# Patient Record
Sex: Male | Born: 1955 | Race: White | Hispanic: No | Marital: Married | Smoking: Current every day smoker
Health system: Southern US, Community
[De-identification: ages and names within clinical notes are randomized; demographics above are authoritative.]

## PROBLEM LIST (undated history)

## (undated) DIAGNOSIS — E119 Type 2 diabetes mellitus without complications: Secondary | ICD-10-CM

## (undated) DIAGNOSIS — E785 Hyperlipidemia, unspecified: Secondary | ICD-10-CM

## (undated) DIAGNOSIS — I10 Essential (primary) hypertension: Secondary | ICD-10-CM

## (undated) DIAGNOSIS — R809 Proteinuria, unspecified: Secondary | ICD-10-CM

## (undated) HISTORY — DX: Hyperlipidemia, unspecified: E78.5

## (undated) HISTORY — DX: Essential (primary) hypertension: I10

## (undated) HISTORY — DX: Proteinuria, unspecified: R80.9

## (undated) HISTORY — PX: BACK SURGERY: SHX140

## (undated) HISTORY — PX: APPENDECTOMY: SHX54

## (undated) HISTORY — DX: Type 2 diabetes mellitus without complications: E11.9

## (undated) HISTORY — PX: REPLACEMENT TOTAL KNEE: SUR1224

---

## 2002-05-03 ENCOUNTER — Ambulatory Visit (HOSPITAL_COMMUNITY): Admission: RE | Admit: 2002-05-03 | Discharge: 2002-05-03 | Payer: Self-pay | Admitting: Pulmonary Disease

## 2002-10-16 ENCOUNTER — Ambulatory Visit (HOSPITAL_COMMUNITY): Admission: RE | Admit: 2002-10-16 | Discharge: 2002-10-16 | Payer: Self-pay | Admitting: Internal Medicine

## 2004-12-02 ENCOUNTER — Ambulatory Visit: Payer: Self-pay | Admitting: Cardiology

## 2004-12-08 ENCOUNTER — Ambulatory Visit: Payer: Self-pay

## 2005-07-20 ENCOUNTER — Ambulatory Visit (HOSPITAL_COMMUNITY): Payer: Self-pay | Admitting: Psychiatry

## 2005-07-21 ENCOUNTER — Inpatient Hospital Stay (HOSPITAL_COMMUNITY): Admission: RE | Admit: 2005-07-21 | Discharge: 2005-07-23 | Payer: Self-pay | Admitting: Psychiatry

## 2005-07-22 ENCOUNTER — Ambulatory Visit: Payer: Self-pay | Admitting: Psychiatry

## 2005-09-27 ENCOUNTER — Ambulatory Visit: Payer: Self-pay | Admitting: Internal Medicine

## 2005-11-15 ENCOUNTER — Ambulatory Visit: Payer: Self-pay | Admitting: Internal Medicine

## 2005-12-28 ENCOUNTER — Ambulatory Visit: Payer: Self-pay | Admitting: Internal Medicine

## 2006-03-30 ENCOUNTER — Ambulatory Visit: Payer: Self-pay | Admitting: Internal Medicine

## 2006-03-30 LAB — CONVERTED CEMR LAB
ALT: 13 units/L (ref 0–40)
AST: 18 units/L (ref 0–37)
Albumin: 3.4 g/dL — ABNORMAL LOW (ref 3.5–5.2)
Alkaline Phosphatase: 56 units/L (ref 39–117)
BUN: 38 mg/dL — ABNORMAL HIGH (ref 6–23)
CO2: 23 meq/L (ref 19–32)
Calcium: 9.8 mg/dL (ref 8.4–10.5)
Chloride: 106 meq/L (ref 96–112)
Cholesterol: 170 mg/dL (ref 0–200)
Creatinine, Ser: 2.1 mg/dL — ABNORMAL HIGH (ref 0.4–1.5)
GFR calc non Af Amer: 36 mL/min
Glomerular Filtration Rate, Af Am: 43 mL/min/{1.73_m2}
Glucose, Bld: 99 mg/dL (ref 70–99)
Hgb A1c MFr Bld: 6.7 % — ABNORMAL HIGH (ref 4.6–6.0)
Potassium: 5.5 meq/L — ABNORMAL HIGH (ref 3.5–5.1)
Sodium: 138 meq/L (ref 135–145)
Total Bilirubin: 0.6 mg/dL (ref 0.3–1.2)
Total Protein: 7.3 g/dL (ref 6.0–8.3)
Uric Acid, Serum: 7.2 mg/dL — ABNORMAL HIGH (ref 2.4–7.0)

## 2006-07-13 ENCOUNTER — Ambulatory Visit: Payer: Self-pay | Admitting: Internal Medicine

## 2006-08-07 ENCOUNTER — Ambulatory Visit: Payer: Self-pay | Admitting: Internal Medicine

## 2006-08-11 ENCOUNTER — Encounter: Admission: RE | Admit: 2006-08-11 | Discharge: 2006-08-11 | Payer: Self-pay | Admitting: Neurosurgery

## 2006-09-05 ENCOUNTER — Ambulatory Visit: Payer: Self-pay | Admitting: Internal Medicine

## 2006-09-05 LAB — CONVERTED CEMR LAB
Albumin: 3.6 g/dL (ref 3.5–5.2)
BUN: 27 mg/dL — ABNORMAL HIGH (ref 6–23)
CO2: 28 meq/L (ref 19–32)
Calcium: 8.9 mg/dL (ref 8.4–10.5)
Chloride: 97 meq/L (ref 96–112)
Creatinine, Ser: 1 mg/dL (ref 0.4–1.5)
GFR calc Af Amer: 102 mL/min
GFR calc non Af Amer: 84 mL/min
Glucose, Bld: 95 mg/dL (ref 70–99)
Hgb A1c MFr Bld: 6 % (ref 4.6–6.0)
Phosphorus: 4.4 mg/dL (ref 2.3–4.6)
Potassium: 5.3 meq/L — ABNORMAL HIGH (ref 3.5–5.1)
Sodium: 136 meq/L (ref 135–145)

## 2006-10-02 ENCOUNTER — Telehealth: Payer: Self-pay | Admitting: *Deleted

## 2006-10-03 ENCOUNTER — Telehealth: Payer: Self-pay | Admitting: *Deleted

## 2006-10-17 ENCOUNTER — Encounter: Payer: Self-pay | Admitting: Internal Medicine

## 2006-10-20 ENCOUNTER — Observation Stay (HOSPITAL_COMMUNITY): Admission: RE | Admit: 2006-10-20 | Discharge: 2006-10-21 | Payer: Self-pay | Admitting: Neurosurgery

## 2006-10-31 ENCOUNTER — Telehealth: Payer: Self-pay | Admitting: Internal Medicine

## 2006-11-02 DIAGNOSIS — I10 Essential (primary) hypertension: Secondary | ICD-10-CM | POA: Insufficient documentation

## 2006-11-02 DIAGNOSIS — M109 Gout, unspecified: Secondary | ICD-10-CM | POA: Insufficient documentation

## 2006-11-02 DIAGNOSIS — K219 Gastro-esophageal reflux disease without esophagitis: Secondary | ICD-10-CM | POA: Insufficient documentation

## 2006-11-02 DIAGNOSIS — E119 Type 2 diabetes mellitus without complications: Secondary | ICD-10-CM | POA: Insufficient documentation

## 2006-11-02 DIAGNOSIS — M545 Low back pain: Secondary | ICD-10-CM

## 2006-11-21 ENCOUNTER — Encounter: Payer: Self-pay | Admitting: Internal Medicine

## 2006-11-28 ENCOUNTER — Telehealth: Payer: Self-pay | Admitting: *Deleted

## 2006-11-30 ENCOUNTER — Encounter: Payer: Self-pay | Admitting: Internal Medicine

## 2006-12-12 ENCOUNTER — Ambulatory Visit: Payer: Self-pay | Admitting: Internal Medicine

## 2006-12-28 ENCOUNTER — Telehealth: Payer: Self-pay | Admitting: Internal Medicine

## 2007-01-03 ENCOUNTER — Telehealth: Payer: Self-pay | Admitting: Internal Medicine

## 2007-01-08 ENCOUNTER — Telehealth: Payer: Self-pay | Admitting: Internal Medicine

## 2007-01-09 ENCOUNTER — Encounter: Payer: Self-pay | Admitting: Internal Medicine

## 2007-01-31 ENCOUNTER — Telehealth: Payer: Self-pay | Admitting: Internal Medicine

## 2007-02-13 ENCOUNTER — Telehealth: Payer: Self-pay | Admitting: Internal Medicine

## 2007-02-14 ENCOUNTER — Telehealth: Payer: Self-pay | Admitting: Internal Medicine

## 2007-03-02 ENCOUNTER — Telehealth: Payer: Self-pay | Admitting: Internal Medicine

## 2007-03-15 ENCOUNTER — Ambulatory Visit: Payer: Self-pay | Admitting: Internal Medicine

## 2007-03-15 DIAGNOSIS — J069 Acute upper respiratory infection, unspecified: Secondary | ICD-10-CM | POA: Insufficient documentation

## 2007-03-16 ENCOUNTER — Telehealth: Payer: Self-pay | Admitting: Internal Medicine

## 2007-04-16 ENCOUNTER — Inpatient Hospital Stay (HOSPITAL_COMMUNITY): Admission: RE | Admit: 2007-04-16 | Discharge: 2007-04-19 | Payer: Self-pay | Admitting: Orthopedic Surgery

## 2007-05-11 ENCOUNTER — Telehealth: Payer: Self-pay | Admitting: Internal Medicine

## 2007-05-16 ENCOUNTER — Telehealth: Payer: Self-pay | Admitting: Internal Medicine

## 2007-05-21 ENCOUNTER — Telehealth: Payer: Self-pay | Admitting: Internal Medicine

## 2007-07-09 ENCOUNTER — Telehealth: Payer: Self-pay | Admitting: Internal Medicine

## 2007-07-25 ENCOUNTER — Telehealth (INDEPENDENT_AMBULATORY_CARE_PROVIDER_SITE_OTHER): Payer: Self-pay | Admitting: *Deleted

## 2007-08-02 ENCOUNTER — Ambulatory Visit: Payer: Self-pay | Admitting: Internal Medicine

## 2007-08-03 LAB — CONVERTED CEMR LAB
ALT: 26 units/L (ref 0–53)
Basophils Absolute: 0 10*3/uL (ref 0.0–0.1)
Basophils Relative: 0.1 % (ref 0.0–1.0)
Bilirubin, Direct: 0.1 mg/dL (ref 0.0–0.3)
CO2: 27 meq/L (ref 19–32)
Calcium: 9.8 mg/dL (ref 8.4–10.5)
Creatinine, Ser: 0.9 mg/dL (ref 0.4–1.5)
Direct LDL: 231.8 mg/dL
GFR calc Af Amer: 114 mL/min
HDL: 73.5 mg/dL (ref >39.0)
Hgb A1c MFr Bld: 6.1 % — ABNORMAL HIGH (ref 4.6–6.0)
Lymphocytes Relative: 12.5 % (ref 12.0–46.0)
MCHC: 33.5 g/dL (ref 30.0–36.0)
MCV: 90.8 fL (ref 78.0–100.0)
Monocytes Absolute: 0.6 10*3/uL (ref 0.1–1.0)
Neutro Abs: 8.8 10*3/uL — ABNORMAL HIGH (ref 1.4–7.7)
PSA: 0.43 ng/mL (ref 0.10–4.00)
RDW: 14.8 % — ABNORMAL HIGH (ref 11.5–14.6)
Sodium: 138 meq/L (ref 135–145)
Total Bilirubin: 1 mg/dL (ref 0.3–1.2)
Total CHOL/HDL Ratio: 4.5
Total Protein: 7.4 g/dL (ref 6.0–8.3)
Triglycerides: 312 mg/dL (ref 0–149)
WBC: 10.9 10*3/uL — ABNORMAL HIGH (ref 4.5–10.5)

## 2007-08-16 ENCOUNTER — Telehealth: Payer: Self-pay | Admitting: Internal Medicine

## 2007-08-22 ENCOUNTER — Telehealth: Payer: Self-pay | Admitting: Internal Medicine

## 2007-09-03 ENCOUNTER — Ambulatory Visit: Payer: Self-pay | Admitting: Internal Medicine

## 2007-09-03 DIAGNOSIS — E7800 Pure hypercholesterolemia, unspecified: Secondary | ICD-10-CM | POA: Insufficient documentation

## 2007-09-03 LAB — CONVERTED CEMR LAB: Hgb A1c MFr Bld: 6.1 % — ABNORMAL HIGH (ref 4.6–6.0)

## 2007-09-04 ENCOUNTER — Encounter: Payer: Self-pay | Admitting: Internal Medicine

## 2007-09-04 ENCOUNTER — Telehealth: Payer: Self-pay | Admitting: Internal Medicine

## 2007-09-10 ENCOUNTER — Telehealth: Payer: Self-pay | Admitting: Internal Medicine

## 2007-09-11 ENCOUNTER — Telehealth: Payer: Self-pay | Admitting: Internal Medicine

## 2007-09-11 ENCOUNTER — Ambulatory Visit: Payer: Self-pay | Admitting: Internal Medicine

## 2007-09-11 DIAGNOSIS — F341 Dysthymic disorder: Secondary | ICD-10-CM

## 2007-09-12 ENCOUNTER — Telehealth (INDEPENDENT_AMBULATORY_CARE_PROVIDER_SITE_OTHER): Payer: Self-pay | Admitting: *Deleted

## 2007-09-12 ENCOUNTER — Encounter: Payer: Self-pay | Admitting: Internal Medicine

## 2007-09-13 ENCOUNTER — Telehealth: Payer: Self-pay | Admitting: Internal Medicine

## 2007-09-18 ENCOUNTER — Telehealth: Payer: Self-pay | Admitting: Internal Medicine

## 2007-09-20 ENCOUNTER — Telehealth: Payer: Self-pay | Admitting: Internal Medicine

## 2007-10-02 ENCOUNTER — Ambulatory Visit: Payer: Self-pay | Admitting: Internal Medicine

## 2007-10-05 ENCOUNTER — Telehealth: Payer: Self-pay | Admitting: Internal Medicine

## 2007-10-19 ENCOUNTER — Telehealth: Payer: Self-pay | Admitting: Internal Medicine

## 2007-10-22 ENCOUNTER — Telehealth: Payer: Self-pay | Admitting: Internal Medicine

## 2007-10-23 ENCOUNTER — Ambulatory Visit: Payer: Self-pay | Admitting: Internal Medicine

## 2007-10-30 ENCOUNTER — Telehealth: Payer: Self-pay | Admitting: Internal Medicine

## 2007-11-01 ENCOUNTER — Telehealth: Payer: Self-pay | Admitting: Internal Medicine

## 2007-11-06 ENCOUNTER — Telehealth: Payer: Self-pay | Admitting: Internal Medicine

## 2007-11-13 ENCOUNTER — Encounter: Payer: Self-pay | Admitting: Internal Medicine

## 2007-11-13 ENCOUNTER — Telehealth: Payer: Self-pay | Admitting: Internal Medicine

## 2007-12-11 ENCOUNTER — Telehealth (INDEPENDENT_AMBULATORY_CARE_PROVIDER_SITE_OTHER): Payer: Self-pay

## 2007-12-17 ENCOUNTER — Ambulatory Visit: Payer: Self-pay | Admitting: Internal Medicine

## 2007-12-20 ENCOUNTER — Telehealth: Payer: Self-pay | Admitting: Internal Medicine

## 2007-12-21 ENCOUNTER — Encounter: Payer: Self-pay | Admitting: Internal Medicine

## 2008-01-10 ENCOUNTER — Telehealth: Payer: Self-pay | Admitting: Internal Medicine

## 2008-01-10 ENCOUNTER — Ambulatory Visit: Payer: Self-pay | Admitting: Internal Medicine

## 2008-01-10 DIAGNOSIS — M25569 Pain in unspecified knee: Secondary | ICD-10-CM | POA: Insufficient documentation

## 2008-01-11 ENCOUNTER — Telehealth: Payer: Self-pay | Admitting: Internal Medicine

## 2008-01-31 ENCOUNTER — Telehealth: Payer: Self-pay | Admitting: Internal Medicine

## 2008-02-25 ENCOUNTER — Emergency Department (HOSPITAL_COMMUNITY): Admission: EM | Admit: 2008-02-25 | Discharge: 2008-02-25 | Payer: Self-pay | Admitting: Emergency Medicine

## 2008-02-29 ENCOUNTER — Telehealth: Payer: Self-pay | Admitting: Internal Medicine

## 2008-03-06 ENCOUNTER — Telehealth: Payer: Self-pay | Admitting: Internal Medicine

## 2008-03-07 ENCOUNTER — Telehealth: Payer: Self-pay | Admitting: Internal Medicine

## 2008-03-11 ENCOUNTER — Telehealth: Payer: Self-pay | Admitting: Internal Medicine

## 2008-04-16 ENCOUNTER — Telehealth (INDEPENDENT_AMBULATORY_CARE_PROVIDER_SITE_OTHER): Payer: Self-pay | Admitting: *Deleted

## 2008-04-17 ENCOUNTER — Encounter: Payer: Self-pay | Admitting: Internal Medicine

## 2008-04-24 ENCOUNTER — Telehealth: Payer: Self-pay | Admitting: Internal Medicine

## 2008-05-09 ENCOUNTER — Encounter: Payer: Self-pay | Admitting: Internal Medicine

## 2008-05-16 ENCOUNTER — Telehealth: Payer: Self-pay | Admitting: Internal Medicine

## 2008-05-19 ENCOUNTER — Telehealth: Payer: Self-pay | Admitting: Internal Medicine

## 2008-07-01 ENCOUNTER — Telehealth: Payer: Self-pay | Admitting: Internal Medicine

## 2008-09-23 ENCOUNTER — Telehealth: Payer: Self-pay | Admitting: Internal Medicine

## 2008-09-24 ENCOUNTER — Telehealth: Payer: Self-pay | Admitting: Internal Medicine

## 2010-02-03 ENCOUNTER — Telehealth: Payer: Self-pay | Admitting: Internal Medicine

## 2010-04-27 NOTE — Progress Notes (Signed)
Summary: No improvement in sx  Phone Note Call from Patient Call back at [facility] Phone [phone]   Caller: Patient Call For: Jeffrey [name] Summary of Call: OV Monday, chest congestion, wheezing, Rx Pro-Air and Robutussin as needed for cough.  Pt calling to report no improvement, still achy all over, diarrhea, lethargic and weak, nausea, no appetite, unsure of temp.  Pt drinking plenty of fluids, and getting lots of rest [facility] [place]    Prenisone 10 mg  #20 one BID  Appended Document: No improvement in sx    Clinical Lists Changes  Medications: Added new medication of PREDNISONE 10 MG TABS (PREDNISONE) one tab two times a day - Signed Rx of PREDNISONE 10 MG TABS (PREDNISONE) one tab two times a day;  #20 x 0;  Signed;  Entered by: Sid Falcon LPN;  Authorized by: Gordy Savers  MD;  Method used: [facility]ically [facility]  [id number], [place], [place], [place]  [address], Ph: [phone] or [phone], Fax: [phone]    Prescriptions: PREDNISONE 10 MG TABS (PREDNISONE) one tab two times a day  #20 x 0   Entered by:   Sid Falcon LPN   Authorized by:   Gordy Savers  MD   Signed by:   Sid Falcon LPN on 34/74/2595   Method used:   [facility]ronically to        [facility]  [id number] (retail)       [address]       [place], [place]  [address]       Ph: [phone] or [phone]       Fax: [phone]   RxID:   [id number]   Rx sent electronically, pt informed. Sid Falcon LPN  December 20, 2007 2:12 PM

## 2010-04-27 NOTE — Progress Notes (Signed)
Summary: REQUEST FOR APPT......?  Phone Note Call from Patient   Caller: Patient  [phone]   Summary of Call: Pt called and wanted to know if Dr Kirtland Bouchard will see him.... 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 Pt advised that he is currently drug free and would like to schedule appt with Dr Kirtland Bouchard....Marland KitchenMarland KitchenI reminded pt that EMR shows he had been d/c from practice and he acknowledged same but states that a lot has changed and he's not the same person... adv he thinks a lot of Dr Kirtland Bouchard - would like to see him again... Asked that request atleast be sent to Dr Kirtland Bouchard...........?    Initial call taken by: Debbra Riding,  February 03, 2010 4:44 PM  Follow-up for Phone Call        no- d/c from practice is permanent Follow-up by: Gordy Savers  MD,  February 04, 2010 8:24 AM  Additional Follow-up for Phone Call Additional follow up Details #1::        [facility]  advising pt to c/b so he can be notified that d/c from practice is permanent per [facility] policy.  Additional Follow-up by: Debbra Riding,  February 04, 2010 9:56 AM

## 2010-08-10 NOTE — H&P (Signed)
Jeffrey [name], Jeffrey [name] NO.:  [id number]   MEDICAL RECORD NO.:  [id number]         PATIENT TYPE:  LINP   LOCATION:                               FACILITY:  [facility]   PHYSICIAN:  Ollen Gross, M.D.    DATE OF BIRTH:  [birth year]   DATE OF ADMISSION:  04/16/2007  DATE OF DISCHARGE:                              HISTORY & PHYSICAL   CHIEF COMPLAINT:  Right knee pain.   HISTORY OF PRESENT ILLNESS:  Patient is a 55 year old male who has been  seen by Dr. Lequita Halt for increasing right knee pain.  He has known  significant arthritis.  He has been treated conservatively in the past  including injections.  Unfortunately, did not get much relief.  He is  seen in the office where x-rays show significant amount of arthritis  with bone-on-bone, severe bony erosion of the medial tibial plateau.  He  has end-stage significant arthritis.  Felt to be a good candidate for  surgery.  Risks and benefits discussed.  Patient subsequently admitted  to the hospital.   ALLERGIES:  Penicillin.   CURRENT MEDICATIONS:  Actos, simvastatin, Cozaar, Tricor, omeprazole,  Ambien, allopurinol, alprazolam, hydrocodone, iron, and multivitamin.   PAST MENTAL HISTORY:  1. Gout.  2. History of seizures one time.  3. Mild anxiety.  4. Hypercholesterolemia.  5. Hypertension.  6. Reflux.  7. Diabetes.  8. History of pancreatitis.   PAST SURGICAL HISTORY:  1. Appendix.  2. Back surgery.   FAMILY HISTORY:  Father with history of congestive heart failure.  Mother with diabetes.  Brother with heart disease and CABG.   SOCIAL HISTORY:  Married.  Works in [employer].  2 stepchildren.  Wife will  be assisting with care after surgery.   REVIEW OF SYSTEMS:  GENERAL:  No fevers, chills, night sweats.  NEURO:  One-time seizure only in March 2007; etiology unknown.  No further  seizures or syncope or paralysis.  RESPIRATORY:  No shortness of  productive cough or hemoptysis.  CARDIOVASCULAR:  No chest pain,  angina,  orthopnea.  GI:  No nausea, vomiting, diarrhea or constipation.  GU:  No  dysuria, hematuria or discharge.  MUSCULOSKELETAL:  Right knee.   PHYSICAL EXAMINATION:  VITAL SIGNS:  Pulse 74, respirations 12, blood  pressure 142/86.  GENERAL:  55 year old white male; well-nourished, well-developed,  overweight, obese.  No acute distress.  Alert, oriented and cooperative.  Good historian.  Accompanied by his wife.  HEENT:  Normocephalic, atraumatic.  Pupils round, reactive.  Oropharynx  clear.  EOMs intact.  NECK:  Supple.  No bruits.  CHEST:  Clear anterior-posterior chest walls.  HEART:  Regular rate and rhythm.  No murmur.  S1, S2 noted.  ABDOMEN:  Soft, nontender.  Bowel sounds present.  Round.  BREASTS/GENITALIA:  Not done, not pertinent to present illness.  EXTREMITIES:  Right knee, marked crepitus on passive range of motion.  Range of motion of 5-120.  Tender more medial than lateral.   IMPRESSION:  1. Severe end-stage arthritis, right knee.  2. History of gout.  3. History of seizures x1, March 2007; etiology  unknown.  4. Mild anxiety.  5. Hypercholesterolemia.  6. Hypertension.  7. Reflux disease.  8. Diabetes.  9. Past history of pancreatitis.   PLAN:  Patient admitted to [facility] to undergo right total  knee replacement arthroplasty.  Surgery will be performed by Dr. Ollen Gross.      Alexzandrew L. Perkins, P.A.C.      Ollen Gross, M.D.  Electronically Signed    ALP/MEDQ  D:  04/15/2007  T:  04/16/2007  Job:  [id number]   cc:   Gordy Savers, MD  [address] [place]  [place] [address]   Madolyn Frieze. Jens Som, MD, [facility]  [address]  [address]  [place]  [place] [address]   Ollen Gross, M.D.  Fax: [phone]

## 2010-08-10 NOTE — Discharge Summary (Signed)
NAMESAIF, [name] NO.:  [id number]   MEDICAL RECORD NO.:  [id number]          PATIENT TYPE:  INP   LOCATION:  1615                         FACILITY:  [facility]   PHYSICIAN:  Ollen Gross, M.D.    DATE OF BIRTH:  01-28-1956   DATE OF ADMISSION:  04/16/2007  DATE OF DISCHARGE:  04/19/2007                               DISCHARGE SUMMARY   ADMISSION DATE:  1. Severe end-stage osteoarthritis, right knee.  2. History of gout.  3. History of seizure x1 in March 2007, etiology unknown.  4. Mild anxiety.  5. Hypercholesterolemia.  6. Hypertension.  7. Reflux disease.  8. Diabetes.  9. Past history of pancreatitis.   DISCHARGE DIAGNOSES:  1. Osteoarthritis, right knee, status post right total knee      arthroplasty.  2. History of gout.  3. History of seizure x1 in March 2007, etiology unknown.  4. Mild anxiety.  5. Hypercholesterolemia.  6. Hypertension.  7. Reflux disease.  8. Diabetes.  9. Past history of pancreatitis.   PROCEDURE:  On April 16, 2007, a right total knee surgery with Dr.  Ollen Gross, assisted by Alexzandrew L. Perkins, P.A.-C.  Anesthesia  was spinal with Duramorph.   CONSULTATIONS:  None.   HISTORY:  Jeffrey [name] is a 55 year old male with severe end-stage arthritis of  the right knee with progressive worsening pain with significant varus  and alignment deformity.  He now presents for a total knee.   LABORATORY DATA:  CBC showed a hemoglobin of 14.2, hematocrit 41.2,  white cell count 6.5, platelets 297.  Postoperative hemoglobin 12.4.  The last H&H 11.6 and 33.0 respectively.  PT and PTT preoperatively were  12.3 and 29 respectively.  INR 0.9.  Serial pro-times were followed and  were 122.4 and 1.9.  Chemistry panel on admission all within normal  limits.  Serial BMETs were followed.  Electrolytes remained within  normal limits.  Preoperative urinalysis with positive protein, 0-2 white  cells, 0-2 red cells, otherwise negative with only  rare bacteria.  Blood  group type O-positive.   HOSPITAL COURSE:  The patient was admitted to [facility] and  tolerated the procedure well.  He was later transferred to the recovery  room and then to the orthopedic floor.  He was started on PCA and p.o.  analgesics following surgery.  He had a fair amount of pain on the  evening of surgery and the morning of day one.  We added OxyContin for  better pain control.  He had decent output.  He started getting up out  of bed on day one.  By day two the pain was under a little bit better  control.  He was getting up and moving around with therapy, walking  about 150 feet.  Dressing change and the incision looked good.  No signs  of infection.  Continued to progress well and was ready to go home by  postoperative day three on April 19, 2007.   DISPOSITION:  The patient was discharged on April 19, 2007.   DISCHARGE MEDICATIONS:  1. Coumadin.  2. OxyContin 20 mg.  3. Percocet.  4. Robaxin.   DIET:  To resume his home diet.   FOLLOWUP:  To follow up in two weeks.   ACTIVITY:  Weightbearing as tolerated.  Home health PT with total knee  protocol.   DISPOSITION/CONDITION ON DISCHARGE:  Discharge to home improved.      Alexzandrew L. Perkins, P.A.C.      Ollen Gross, M.D.  Electronically Signed    ALP/MEDQ  D:  06/05/2007  T:  06/05/2007  Job:  [id number]   cc:   Gordy Savers, MD  [address] [place]  [place] [address]   Madolyn Frieze. Jens Som, MD, [facility]  [address]  [address]  [place]  [place] [address]

## 2010-08-10 NOTE — Assessment & Plan Note (Signed)
[facility]                                 ON-CALL NOTE   Jeffrey, [name]                       MRN:          [id number]  DATE:09/10/2007                            DOB:          February 19, 1956    Phone number [phone].   PRIMARY CARE PHYSICIAN:  Dr. Amador [name]   SUBJECTIVE:  The patient was returning a call from Dr. Amador [name] about  getting him disability.   ASSESSMENT AND PLAN:  Tried to call the [facility] office after hours to  see if Dr. Amador [name] was still present, no answer on back line.  The  patient was told that if I did not return his call, that Dr. Amador [name]  would contact him tomorrow.     Jeffrey Nora, MD  Electronically Signed    AB/MedQ  DD: 09/10/2007  DT: 09/11/2007  Job #: [id number]

## 2010-08-10 NOTE — Op Note (Signed)
Jeffrey [name], Jeffrey [name] NO.:  [id number]   MEDICAL RECORD NO.:  [id number]          PATIENT TYPE:  INP   LOCATION:  0007                         FACILITY:  [facility]   PHYSICIAN:  Ollen Gross, M.D.    DATE OF BIRTH:  [birth year]   DATE OF PROCEDURE:  04/16/2007  DATE OF DISCHARGE:                               OPERATIVE REPORT   PREOPERATIVE DIAGNOSIS:  Osteoarthritis right knee.   POSTOPERATIVE DIAGNOSIS:  Osteoarthritis right knee.   PROCEDURE:  Right total knee arthroplasty.   SURGEON:  Ollen Gross, M.D.   ASSISTANT:  Avel Peace PA-C   ANESTHESIA:  Spinal with Duramorph.   ESTIMATED BLOOD LOSS:  Minimal.   DRAINS:  Hemovac times one.   TOURNIQUET TIME:  52 minutes at 300 mmHg.   COMPLICATIONS:  None.   CONDITION:  Stable to recovery room.   BRIEF CLINICAL NOTE:  Jeffrey [name] is a 55 year old male who has severe end-  stage arthritis of the right knee with progressively worsening pain and  dysfunction.  He had significant varus deformity with some fragmentation  of the base of the defect in the medial tibia.  He presents now for  total knee arthroplasty secondary to failure of nonop management.   PROCEDURE IN DETAIL:  After successful administration of spinal  anesthetic, a tourniquet placed on the right thigh and right lower  extremity prepped and draped in the usual sterile fashion.  The  extremity was wrapped in Esmarch, knee flexed and tourniquet inflated to  300 mmHg.  Midline incision is made with 10 blade through subcutaneous  tissue to the level of the extensor mechanism.  A fresh blade is used  make a medial parapatellar arthrotomy.  Soft tissue over the proximal  medial tibia subperiosteally elevated to the joint line with the knife  into the semimembranosus bursa with a Cobb elevator.  There was a lot of  spurring medially and I removed some of those spurs at this point.  Laterally we elevated the soft tissue with attention being paid to  avoid  patellar tendon on tibial tubercle.  The patella was everted, knee  flexed 90 degrees, ACL and PCL removed.  A drill was used create a  starting hole in the distal femur and the canal was thoroughly  irrigated.  The 5 degrees right valgus alignment guide is placed and  referencing off the posterior condyles rotations marked and the block  pinned to remove 11 mm of the distal femur.  I took 11 because of his  preop flexion contracture.  Distal femoral resection is made with an  oscillating saw.  Sizing block is placed and five is most appropriate.  Rotations marked off the epicondylar axis.  The size 5 cutting block is  placed and the anterior-posterior and chamfer cuts made.   Tibia subluxed forward and menisci removed.  The extramedullary tibial  alignment guide is placed referencing proximally at the medial aspect of  the tibial tubercle and distally along the second metatarsal axis and  tibial crest.  The block is pinned to remove about 2 mm off the tibial  defect  medially.  Tibial resection is made with an oscillating saw.  Size 5 is the most appropriate tibial component.  I decided to use an  MBT revision tray because of his size and because of the defect  medially.  I felt as though the standard tray would not give [place] enough  metaphyseal fixation to help bypass some of the medial bone.  The  proximal drill and canal drill was used for the MTB revision tray.  We  then did the keel punch.  It was an excellent fit with this tray.  The  femoral preparation is then completed with the intercondylar cut.   Size 5 MBT tibial tray with a size 5 posterior stabilized femur and a 10  mm posterior stabilized rotating platform inserter placed.  With a 10,  full extension is achieved with excellent varus and valgus balance  throughout full range of motion.  Patella was everted, thickness  measured 27 mm.  Freehand resection taken to 15 mm, 38 template is  placed, lug holes were drilled,  trial patella was placed and it tracks  normally.  Osteophytes removed off the posterior femur with the trial in  place.  All trials were removed and the cut bone surfaces are prepared  with pulsatile lavage.  Cement was mixed and once ready for  implantation, a size 5 MBT revision tray, size 5 posterior stabilized  femur and 38 patella are cemented in place.  Patella is held with a  clamp.  Trial 10-mm inserts placed, knee held in full extension and all  extruded cement removed.  When the cement is fully hardened then the  permanent 10 mm posterior stabilized rotating platform insert is placed  into the tibial tray.  Wound was copiously irrigated with saline  solution and FloSeal injected on the medial and lateral gutters and  posterior capsule.  Moist sponge is placed and tourniquet released with  total time of 52 minutes.  Mild bleeding stopped with electrocautery.  Further irrigation was performed and the extensor mechanism closed over  Hemovac drain with interrupted #1 PDS.  Patella tracks normally in  flexion against gravity to about 125 degrees at which point the  posterior calf and thigh meet.  Subcu is then closed with interrupted 2-  0 Vicryl subcuticular running 4-0 Monocryl.  The incision is cleaned and  dried and Steri-Strips and a bulky sterile dressing applied.  He is  placed into a knee immobilizer, awakened and transported to recovery in  stable condition.      Ollen Gross, M.D.  Electronically Signed     FA/MEDQ  D:  04/16/2007  T:  04/17/2007  Job:  [id number]

## 2010-08-10 NOTE — Op Note (Signed)
NAME:  LASALLE, ABEE NO.:  [id number]   MEDICAL RECORD NO.:  [id number]          PATIENT TYPE:  AMB   LOCATION:  SDS                          FACILITY:  [facility]   PHYSICIAN:  Reinaldo Meeker, M.D. DATE OF BIRTH:  1956-03-18   DATE OF PROCEDURE:  10/20/2006  DATE OF DISCHARGE:                               OPERATIVE REPORT   PREOPERATIVE DIAGNOSIS:  Spondylosis L3-L4 and L4-L5, right, with  possible herniated disc.   POSTOPERATIVE DIAGNOSIS:  Spondylosis L3-L4 and L4-L5, right, with  bulging disc.   PROCEDURE:  Right L3-L4 and L4-L5 interlaminar laminotomy for  decompression of L4 and L5 nerve roots.   SURGEON:  Reinaldo Meeker, M.D.   ASSISTANT:  Tia Alert, M.D.   PROCEDURE IN DETAIL:  After being placed in the prone position, the  patient's back was prepped and draped in the usual sterile fashion.  Localizing x-ray was taken prior to incision to identify the appropriate  level.  A midline incision was made above the spinous process of L3, L4,  and L5.  Using the Bovie cutting current, the incision was carried down  the spinous processes.  Subperiosteal dissection was then carried out on  the right sided spinous process and lamina of facet joint.  A self-  retaining retractor was placed for exposure.  An x-ray showed we  approached the appropriate level.  Starting at L3-L4. a laminotomy was  performed by removing the inferior 1/2 of the L3 lamina, the medial 1/3  of the facet joint, and the superior 1/3 of the L4 lamina.  Residual  bone and hypertrophic ligamentum flavum were removed in a piecemeal  fashion.  The L4 nerve root was then followed out to the foramen for a  thorough decompression.  Attention was then turned L4-L5 where a similar  procedure was carried out.  The superior 1/2 of the L4 lamina, the  medial 1/3 of the facet joint, and the superior 1/2 of the L5 lamina  were once again removed.  The ligamentum flavum along with a ligamentum  flavum cyst were removed to decompress the dura and the L5 nerve was  tracked generously at its foramen, as well.  At this time, the midline  was undercut to help with the midline stenosis.  At this time,  inspection was carried out at both levels for any evidence of residual  compression and none could be identified.  The discs were evaluated and  found not to be herniated, only bulging, and not in need of removal.  At  this time, large amounts of irrigation were carried out.  Any bleeding  was controlled with bipolar coagulation and Gelfoam.  The wound was then  closed in multiple layers of Vicryl in the muscle, fascia, subcutaneous,  and subcuticular tissues and staples were placed on the skin.  A sterile  dressing was then applied.  The patient was extubated and taken to the  recovery room  in stable condition.           ______________________________  Reinaldo Meeker, M.D.     ROK/MEDQ  D:  10/20/2006  T:  10/20/2006  Job:  [id number]

## 2010-08-13 NOTE — Assessment & Plan Note (Signed)
[facility]                                 ON-CALL NOTE   [name], [name]                       MRN:          [id number]  DATE:08/13/2007                            DOB:          Nov 28, 1955    PATIENT NAME:  Jeffrey [name].   DATE OF INTERACTION:  Aug 13, 2007, at 5:22 p.m.   PHONE NUMBER:  [phone].   OBJECTIVE:  Patient is a diabetic, who had back surgery and knee  replacement last year, had a bad case of chiggers not long ago and was  seen by Dr. Schuyler Amor and put on prednisone.  Thinks he also was  bitten by ticks and now is having vomiting and headache, has some other  complaints as well.  I told the patient he really needs to be evaluated  and he will call tomorrow for an appointment to come in and be seen.  He  lives in [place], [place], and he lives up near [place].   PRIMARY CARE [place] Service:  Gordy Savers, MD, and home office is  [facility].     Arta Silence, MD  Electronically Signed    RNS/MedQ  DD: 08/13/2007  DT: 08/13/2007  Job #: [id number]

## 2010-08-13 NOTE — Assessment & Plan Note (Signed)
[facility] OFFICE NOTE   Jeffrey [name], Jeffrey [name]                     MRN:          [id number]  DATE:07/13/2006                            DOB:          1955-08-06    A 55 year old gentleman with a history of diabetes, hypertension,  dyslipidemia, gout. He stopped allopurinol stating that it has not  helped his gout. He was unaware this was a preventative strategy and not  for acute gout. Despite our prior warnings, he continues to take Indocin  p.r.n. gout. He does not take colchicine because says it causes GI  upset, but apparently he took this on an empty stomach. He is scheduled  for rheumatology evaluation per his request for further evaluation of  his gout.   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   He presents with significant weight gain over the past three months.   PHYSICAL EXAMINATION:  Blood pressure 150/104, weight 273.  Fundi not well-visualized, but no gross retinopathy.  NECK: No bruits.  CHEST: Clear.  CARDIOVASCULAR: Normal.  ABDOMEN: Obese.   IMPRESSION:  1. Diabetes.  2. Hypertension, poor control.  3. Chronic gout.  4. Obesity.   DISPOSITION:  Detailed written instructions were provided. He will  resume allopurinol at a dose of 300 mg daily. Had tolerated a 600 mg  daily in divided doses earlier. He is scheduled for rheumatology  evaluation and will consider increasing the allopurinol dose at that  time. His uric acid level was 7.2 on 300 mg daily with breakthrough  acute gouty arthritis. He is on Tricor which has a uricose uric effect.  Will discontinue the lisinopril and place on Cozaar 100 mg daily for  added uricose uric effect. Will reassess his blood pressure in two  weeks. He was told to dispose of all Indocin and not take any NSAIDs. He  will discuss with his rheumatologist risks/benefit ratio of baby aspirin  as far as primary prevention versus aggravating  gout. Repeat renal  function indices will be checked in two weeks and his blood pressure  will be reassessed. His controlled medications were refilled. He also  states that he is agreeable to having a followup eye examination which  is he is overdue for.     Gordy Savers, MD  Electronically Signed   PFK/MedQ  DD: 07/13/2006  DT: 07/13/2006  Job #: [id number]

## 2010-08-13 NOTE — Op Note (Signed)
NAME:  Jeffrey [name], Jeffrey [name]                        ACCOUNT NO.:  [id number]   MEDICAL RECORD NO.:  [id number]                   PATIENT TYPE:  AMB   LOCATION:  DAY                                  FACILITY:  [facility]   PHYSICIAN:  Lionel December, M.D.                 DATE OF BIRTH:  10/05/1955   DATE OF PROCEDURE:  10/16/2002  DATE OF DISCHARGE:                                 OPERATIVE REPORT   PROCEDURE:  Esophagogastroduodenoscopy followed by total colonoscopy.   INDICATIONS FOR PROCEDURE:  Jeffrey [name] is a 55 year old Caucasian male who was  recently found to have iron deficiency anemia.  He does not history of  melena, rectal bleeding, or heme-positive stools.  He has been on Vioxx, and  on top of that, he uses indomethacin sporadically for gout.  He is  undergoing diagnostic evaluation.  The procedure was reviewed with the  patient, and informed consent was obtained.   PREOPERATIVE MEDICATIONS:  Cetacaine spray for oropharyngeal topical  anesthesia, Demerol 50 mg IV, Versed 5 mg IV in divided doses.   FINDINGS:  The procedure was performed in the endoscopy suite.  The  patient's vital signs and O2 saturations were monitored during the procedure  and remained stable.   PROCEDURE #1, ESOPHAGOGASTRODUODENOSCOPY:  The patient was placed in the  left lateral recumbent position.  The Olympus videoscope was passed via the  oropharynx without any difficulty into the esophagus.   Esophagus:  The mucosa of the esophagus was normal throughout.  The  squamocolumnar junction was wavy, and there was a small sliding hiatal  hernia no more than 3 cm in length.   Stomach:  It was empty and distended very well with insufflation.  A few  erosions were noted at antrum as well as petechiae.  No ulcer crater was  noted.  The pyloric channel was patent.  The angularis, fundus, and cardia  were examined by retroflexing the scope and were normal other than  petechiae.   Duodenum:  Examination of the bulb  revealed normal mucosa.  The folds of the  mucosa and second part of the duodenum were normal.  The endoscope was  withdrawn and the patient prepared for procedure #2.   PROCEDURE #2, COLONOSCOPY:  Rectal examination was performed.  No  abnormality noted on external or digital exam.  The Olympus videoscope was  placed into the rectum and advanced to the region of the sigmoid colon and  beyond.  Redundant sigmoid colon.  The scope was passed beyond the splenic  flexure and could not be advanced any further.  The scope was exchanged with  a pediatric scope.  The patient was then placed on his right side, and I was  able to pass the scope to the hepatic flexure and finally to the cecum.  The  cecum was identified by the appendiceal orifice and ileocecal valve.  Pictures were taken for the record.  As the scope was withdrawn, the colonic  mucosa was carefully examined.  There was a small polyp at the ascending  colon which was ablated by cold biopsy.  Two tiny diverticula were noted at  the sigmoid colon.  The rectal mucosa was normal.  The scope was retroflexed  to examine the anorectal junction, and a single hemorrhoid was noted above  the dentate line.  The endoscope was straightened and withdrawn.  The  patient tolerated the procedure well.    FINAL DIAGNOSES:  1. Small sliding hiatal hernia with a wavy gastroesophageal junction.  2. Erosive antral gastritis with multiple petechiae.  Suspect his blood loss     could be from nonsteroidal anti-inflammatory drug-induced gastric injury     but need to make sure he does not have Helicobacter pylori infection.  3. Colonoscopy performed to the cecum.  Small polyp ablated by cold biopsy     from ascending colon.  4. Diverticula at sigmoid colon and single internal hemorrhoid.   RECOMMENDATIONS:  1. Will stop his ranitidine.  He will continue antireflux measures as     before.  2. Omeprazole 20 mg p.o. q.a.m.  Prescription given for 30 with  11 refills.  3. High fiber diet.  4. He will continue his iron pill.  May take twice a day with meals.  5. H pylori serology will be checked.  I will be contacting the patient with     the results of biopsy and blood test and further recommendations.                                               Lionel December, M.D.    NR/MEDQ  D:  10/16/2002  T:  10/16/2002  Job:  [id number]   cc:   Ramon Dredge L. Juanetta Gosling, M.D.  [address]  [place]  [place] [address]  Fax: [phone]

## 2010-08-13 NOTE — Assessment & Plan Note (Signed)
[facility]                                 ON-CALL NOTE   [name], Jeffrey                       MRN:          [id number]  DATE:11/25/2006                            DOB:          1955/10/07    The patient's wife is calling because he has been out of town.  He has  severe pain.  He thinks it may be gout.  Advised him to go to the local  urgent care for eval.  When I called the phone number they gave [place] was  disconnected.  I left them a message at the answering service that when  they call back to direct them to the local urgent care or emergency room  in [place] for eval.     Tinnie Gens A. Tawanna Cooler, MD  Electronically Signed    JAT/MedQ  DD: 11/25/2006  DT: 11/26/2006  Job #: [id number]

## 2010-08-13 NOTE — Consult Note (Signed)
Jeffrey [name], Jeffrey [name] NO.:  [id number]   MEDICAL RECORD NO.:  [id number]                  PATIENT TYPE:   LOCATION:                                       FACILITY:   PHYSICIAN:  Lionel December, M.D.                 DATE OF BIRTH:  November 13, 1955   DATE OF CONSULTATION:  10/03/2002  DATE OF DISCHARGE:                                   CONSULTATION   GI CONSULTATION   REQUESTING PHYSICIAN:  Oneal Deputy. Juanetta Gosling, M.D.   REASON FOR CONSULTATION:  Iron-deficiency anemia.   HISTORY OF PRESENT ILLNESS:  The patient is a 55 year old Caucasian  gentleman, patient of Dr. Juanetta Gosling, who presents for a further evaluation of  the above stated findings.  Approximately 1 month ago he began having  ringing in his ears, fatigue, and dizzy feeling. He saw Dr. Juanetta Gosling who  checked blood work.  He was found to have a hemoglobin of 10.6, hematocrit  33.3, and MCV 86.  Iron was low normal at 69.  Iron saturation is low at  15%, TIBC elevated at 468.  B12 and folate were normal.  Three stool  Hemoccults were negative.   The patient began iron therapy about 3 weeks ago and started feeling better  at this point.  The patient actually found out that he was anemic when he  tried to donate blood with the [employer].  He was denied.  He  denies any frequent blood donation. He denies any melena or rectal bleeding  or abdominal pain.  He has a history of reflux, especially in the past when  he consumed alcohol.  He is on Zantac 150 mg b.i.d. with no reflux symptoms.  He has noted a decrease in the number of bowel movements.  He has lost about  25 pounds on a low carb diet.  He denies any history of peptic ulcer  disease. He has never had an upper endoscopy.  He quit tobacco and alcohol  use over 3 years ago.  He has been on Vioxx intermittently over the last 4-5  months.   CURRENT MEDICATIONS:  1. Avandamet 2 mg/5000 mg 1 b.i.d.  2. Mavik 4 mg daily.  3. Wellbutrin XL 150  mg b.i.d.  4. Hydrochlorothiazide 25 mg 1/2 tablet q.h.s.  5. Ambien 10 mg q.h.s.  6. Indomethacin 75 mg p.r.n.  7. Gemfibrozil 600 mg b.i.d.  8. Vioxx 25 mg daily.  9. Hydrocodone 5/500 one q.i.d.  10.      Crestor 10 mg b.i.d.  11.      Ranitidine 150 mg b.i.d.  12.      Equate multivitamin.  13.      Lycopene 1 daily.  14.      L-lysine 500 mg daily.  15.      Vitamine E 400 IU daily.  16.      Glucosamine chondroitin 1 q.i.d.  17.      Iron 65 mg 1 daily.   ALLERGIES:  PENICILLIN.   PAST MEDICAL HISTORY:  1. Hypertension.  2. Diabetes mellitus.  3. Hypercholesterolemia.  4. He has gastroesophageal reflux disease.  5. Back problems, contemplating surgery.  6. Anxiety.  7. History of gout.  8. He has a history of alcohol abuse in the past.  Three years ago he     developed alcoholic pancreatitis and was hospitalized for over 13 days.     He was diagnosed with diabetes mellitus at that time.  He quit drinking     at that time.  9. He has had an appendectomy.   FAMILY HISTORY:  Mother and father both have diabetes mellitus.  No family  history of colorectal cancer.   SOCIAL HISTORY:  He is married for 2 years.  He has no children.  He is  employed at the [employer] in [place].  He denies any tobacco or alcohol  use.   REVIEW OF SYSTEMS:  Please see HPI for GI and general.  CARDIOPULMONARY:  Denies any chest pain or shortness of breath HEENT:  Denies any nosebleed.  Complains of bleeding wisdom tooth.  GENITOURINARY: Denies any hematuria.   PHYSICAL EXAMINATION:  VITAL SIGNS:  Weight 225.  Height 5 feet 6 inches.  Temperature 97.2, blood pressure 128/78, pulse 80.  GENERAL:  A pleasant, well-nourished, well-developed, Caucasian male in no  acute distress.  SKIN:  Warm and dry.  No jaundice.  HEENT:  Conjunctivae are pink.  Sclerae anicteric.  Oropharyngeal mucosa  moist and pink.  No lesions, erythema or exudate.  NECK:  No lymphadenopathy or thyromegaly.  CHEST:   Lungs are clear to auscultation.  CARDIOVASCULAR:  Cardiac exam reveals regular rate and rhythm.  Normal S1,  S2.  No murmurs, rubs, or gallops.  ABDOMEN:  Positive bowel sounds, soft, nontender, nondistended.  His liver  edge is palpable in the right costal margin in the midclavicular line.  No  splenomegaly.  Abdomen is nontender.  EXTREMITIES:  No edema.   IMPRESSION:  Patient is a 55 year old gentleman who is recently found to be  mildly anemia.  Anemia profile is suggested with iron-deficiency anemia.  Three Hemoccults have been Normal EKG.  He has a history of chronic massive  reflux which has been well-controlled.  Rather than use biopsies.  He is  asymptomatic.  He does use Vioxx which would put him at increased risk for  erosions or ulcer.  He has noted decreased frequency of bowel movements,  although I feel that this is most likely dilated related.  Given the signs,  I have recommended colonoscopy.  If negative, to consider upper endoscopy,  especially in light of chronic reflux symptoms.   PLAN:  1. Colonoscopy plus-or-minus EGD.  2.     Will obtain a CBC at endoscopy.  3. He will continue ranitidine for now.   I would like to thank Dr. Juanetta Gosling to take part in the care of this patient.     Tana Coast, P.A.                        Lionel December, M.D.    LL/MEDQ  D:  10/03/2002  T:  10/03/2002  Job:  [id number]   cc:   Ramon Dredge L. Juanetta Gosling, M.D.  [address]  [place]  [place] [address]  Fax: [phone]

## 2010-08-13 NOTE — Assessment & Plan Note (Signed)
[facility]                                   ON-CALL NOTE   [name], [name]                       MRN:          [id number]  DATE:10/14/2005                            DOB:          [birth year]    TELEPHONE TRIAGE CALL:  Patient Jeffrey [name].  He sees Dr. Amador Cunas.   Telephone [phone]   The patient states that Dr. Amador Cunas called a prescription in to a local  pharmacy for narcotics for his chronic back pain.  The pharmacy apparently  has refused to fill it.  The patient asks that I call in a similar  prescription to a different pharmacy.  My response is that, no, I cannot do  that.  He needs to contact Dr. Amador Cunas on Monday.                                   Tera Mater. Clent Ridges, MD   SAF/MedQ  DD:  10/14/2005  DT:  10/15/2005  Job #:  [id number]

## 2010-08-13 NOTE — Discharge Summary (Signed)
NAMECARNELL, BEAVERS NO.:  [id number]   MEDICAL RECORD NO.:  [id number]          PATIENT TYPE:  IPS   LOCATION:  0502                          FACILITY:  [facility]   PHYSICIAN:  Anselm Jungling, MD  DATE OF BIRTH:  1955-06-10   DATE OF ADMISSION:  07/21/2005  DATE OF DISCHARGE:  07/23/2005                                 DISCHARGE SUMMARY   IDENTIFYING DATA AND REASON FOR ADMISSION:  This was the first [facility] admission  and first inpatient psychiatric hospitalization ever for Jeffrey [name], a 55-year-  old white male admitted because of concerns about Xanax dependence.  The  patient had gotten into a pattern of taking up to 6 mg daily of Xanax, which  was prescribed for him by his physicians.  He had no prior history of  chemical dependency or substance abuse whatsoever.  He had run out of Xanax  several days prior to admission and had become increasingly anxious.  The  patient agreed that he needed to get off Xanax.  The patient has also been  taking hydrocodone on a prescribed basis for severe back injury, but had not  been abusing that.  Please refer to the admission note for further details  pertaining to the symptoms, circumstances and history that led to his  hospitalization.  He was given an initial Axis I diagnosis of benzodiazepine  dependence, which was felt to be partly iatrogenic.   MEDICAL AND LABORATORY:  The patient came to [place] with several medical  problems.  He was medically and physically assessed by the psychiatric nurse  practitioner upon admission.  He came to [place] on a regimen of Actos 30 mg  daily, metformin HCl 500 mg b.i.d., hydrochlorothiazide 25 mg tablet 1/2  daily, Tricor 145 mg daily, lisinopril 20 mg daily, gemfibrozil 500 mg  daily, omeprazole 20 mg b.i.d., indomethacin 75 mg p.r.n., and ferrous  sulfate 324 mg daily.  In addition, he was taking hydrocodone/APAP 10/500  every 4 hours as needed for back pain.  The above medications were  continued.   Laboratory studies indicated mildly elevated BUN and creatinine.  Because of this, metformin was discontinued.  The patient was placed on  insulin sliding scale.  His blood pressure remained well-controlled during  his inpatient stay.  He was continued on other medications as above.   HOSPITAL COURSE:  The patient was placed on a detoxification protocol based  upon low-dose Librium.   He presented as a pleasant, fully oriented, well-nourished, well-developed  adult male who was quite open, and admitted to needing to address his Xanax  dependency.  He did not appear to be depressed.  His mood was neutral, with  appropriate affect.  There was nothing to suggest any signs or symptoms of  psychosis or any other psychiatric disturbance.   By the third hospital day, it was apparent that the patient was not really  requiring inpatient psychiatric care any further.  He was felt to be  trustworthy regarding an outpatient continuation of his Librium withdrawal  protocol.  He was discharged on a regimen of Librium 25 mg  t.i.d. for 1  week, to then be decreased to Librium 25 mg 2 times daily, until he was to  see his physician, Dr. Juanetta Gosling.   AFTERCARE:  The patient was to follow-up with Dr. Juanetta Gosling, appointment to be  arranged at the time of discharge.  He was not felt to be a good candidate  for chemical dependency, rehab/12-step treatment as per se, as he did not  have many of the earmarks of substance abuse and dependency, such as  obsession, craving, etc.   DISCHARGE MEDICATIONS:  1.  Actos 30 mg daily.  2.  Tricor 145 mg daily.  3.  Lisinopril 20 mg daily.  4.  Lopid 600 mg daily.  5.  Ferrous fumarate 324 mg daily.  6.  Librium 25 mg t.i.d. for 1 week, followed by Librium 25 mg b.i.d., until      he sees Dr. Juanetta Gosling.  7.  Protonix 40 mg b.i.d.  8.  He was also discharged on a sliding scale insulin.   DISCHARGE DIAGNOSES:  AXIS I:  Benzodiazepine dependence, early remission.  AXIS  II:  Deferred.  AXIS III:  History of hypertension, chronic back pain, anemia, GERD,  diabetes mellitus.  AXIS IV:  Stressors severe.  AXIS V:  GAF on discharge 75.           ______________________________  Anselm Jungling, MD  Electronically Signed     SPB/MEDQ  D:  07/25/2005  T:  07/25/2005  Job:  [id number]

## 2010-12-16 LAB — URINE MICROSCOPIC-ADD ON

## 2010-12-16 LAB — BASIC METABOLIC PANEL
BUN: 14
CO2: 32
Calcium: 8.6
Calcium: 8.8
Creatinine, Ser: 0.73
Creatinine, Ser: 0.79
GFR calc Af Amer: >60
GFR calc non Af Amer: >60
GFR calc non Af Amer: >60
Glucose, Bld: 127 — ABNORMAL HIGH
Potassium: 4.3
Sodium: 137

## 2010-12-16 LAB — COMPREHENSIVE METABOLIC PANEL
AST: 29
Albumin: 3.7
BUN: 21
Creatinine, Ser: 0.73
GFR calc Af Amer: >60
Total Protein: 6.7

## 2010-12-16 LAB — CBC
HCT: 35.3 — ABNORMAL LOW
HCT: 41.2
Hemoglobin: 11.6 — ABNORMAL LOW
MCHC: 35.5
MCV: 91.2
Platelets: 234
Platelets: 250
Platelets: 297
RBC: 3.75 — ABNORMAL LOW
RDW: 14.4
RDW: 14.7
RDW: 15
RDW: 15.5
WBC: 8.5
WBC: 9.4

## 2010-12-16 LAB — URINALYSIS, ROUTINE W REFLEX MICROSCOPIC
Bilirubin Urine: NEGATIVE
Glucose, UA: NEGATIVE
Hgb urine dipstick: NEGATIVE
Ketones, ur: NEGATIVE
Leukocytes, UA: NEGATIVE
Protein, ur: >300 — AB
pH: 7.5

## 2010-12-16 LAB — PROTIME-INR
INR: 0.9
INR: 1.2
INR: 1.9 — ABNORMAL HIGH
Prothrombin Time: 12.9
Prothrombin Time: 15.9 — ABNORMAL HIGH

## 2010-12-16 LAB — TYPE AND SCREEN
ABO/RH(D): O POS
Antibody Screen: NEGATIVE

## 2010-12-16 LAB — ABO/RH: ABO/RH(D): O POS

## 2011-01-10 LAB — CBC
RBC: 3.89 — ABNORMAL LOW
WBC: 7.2

## 2011-01-10 LAB — BASIC METABOLIC PANEL
Calcium: 8.9
Creatinine, Ser: 0.71
GFR calc Af Amer: >60
GFR calc non Af Amer: >60
Sodium: 138

## 2013-06-25 ENCOUNTER — Ambulatory Visit: Payer: Self-pay | Admitting: Physical Therapy

## 2015-05-28 DIAGNOSIS — F411 Generalized anxiety disorder: Secondary | ICD-10-CM | POA: Diagnosis present

## 2015-05-31 DIAGNOSIS — M48061 Spinal stenosis, lumbar region without neurogenic claudication: Secondary | ICD-10-CM | POA: Insufficient documentation

## 2015-05-31 DIAGNOSIS — K760 Fatty (change of) liver, not elsewhere classified: Secondary | ICD-10-CM | POA: Diagnosis present

## 2015-05-31 DIAGNOSIS — M5116 Intervertebral disc disorders with radiculopathy, lumbar region: Secondary | ICD-10-CM | POA: Insufficient documentation

## 2015-10-08 DIAGNOSIS — I491 Atrial premature depolarization: Secondary | ICD-10-CM | POA: Insufficient documentation

## 2016-02-02 ENCOUNTER — Ambulatory Visit: Payer: Self-pay | Admitting: Cardiology

## 2016-02-02 NOTE — Progress Notes (Signed)
HPI: 60 year old male for evaluation of dyspnea. Patient seen previously but not since 2006. Patient has dyspnea with more extreme activities not routine activities. No orthopnea, PND, pedal edema, chest pain or syncope. He is contemplating initiating an exercise program and is concerned about his risk factors.   Current Outpatient Prescriptions  Medication Sig Dispense Refill  . ALPRAZolam (XANAX) 0.5 MG tablet Take 0.5 mg by mouth 3 (three) times daily as needed for anxiety.    Marland Kitchen. amLODipine (NORVASC) 10 MG tablet Take 10 mg by mouth daily.    Marland Kitchen. buPROPion (WELLBUTRIN) 100 MG tablet Take 100 mg by mouth 2 (two) times daily.    Marland Kitchen. doxepin (SINEQUAN) 10 MG capsule Take 10 mg by mouth at bedtime.    . DULoxetine (CYMBALTA) 60 MG capsule Take 60 mg by mouth 2 (two) times daily.    . famotidine (PEPCID) 20 MG tablet Take 20 mg by mouth daily.    . fenofibrate (TRICOR) 145 MG tablet Take 145 mg by mouth daily.    Marland Kitchen. HYDROcodone-acetaminophen (NORCO) 10-325 MG tablet Take 1 tablet by mouth daily as needed.    . Insulin Glargine (LANTUS SOLOSTAR [place]) Inject 35 Units into the skin every evening.    . metFORMIN (GLUCOPHAGE) 500 MG tablet Take 1,000 mg by mouth 2 (two) times daily with a meal.    . methadone (DOLOPHINE) 10 MG tablet Take 30 mg by mouth 2 (two) times daily.    Marland Kitchen. omeprazole (PRILOSEC) 40 MG capsule Take 40 mg by mouth 2 (two) times daily.    . rosuvastatin (CRESTOR) 20 MG tablet Take 20 mg by mouth daily.    Marland Kitchen. tiZANidine (ZANAFLEX) 4 MG tablet Take 4 mg by mouth every 8 (eight) hours as needed for muscle spasms.    . valsartan-hydrochlorothiazide (DIOVAN-HCT) 320-25 MG tablet Take 1 tablet by mouth daily.     No current facility-administered medications for this visit.     Allergies  Allergen Reactions  . Penicillins      Past Medical History:  Diagnosis Date  . DM (diabetes mellitus) (HCC)   . Hyperlipidemia   . Hypertension   . Proteinuria     Past Surgical History:    Procedure Laterality Date  . APPENDECTOMY    . BACK SURGERY    . REPLACEMENT TOTAL KNEE      Social History   Social History  . Marital status: Married    Spouse name: N/A  . Number of children: 2  . Years of education: N/A   Occupational History  . Not on file.   Social History Main Topics  . Smoking status: Current Every Day Smoker  . Smokeless tobacco: Not on file  . Alcohol use No  . Drug use: Unknown  . Sexual activity: Not on file   Other Topics Concern  . Not on file   Social History Narrative  . No narrative on file    Family History  Problem Relation Age of Onset  . Aortic stenosis Mother   . CAD Brother     ROS: no fevers or chills, productive cough, hemoptysis, dysphasia, odynophagia, melena, hematochezia, dysuria, hematuria, rash, seizure activity, orthopnea, PND, pedal edema, claudication. Remaining systems are negative.  Physical Exam:   Blood pressure 128/82, pulse 79, height 5\' 5"  (1.651 m), weight 254 lb (115.2 kg).  General:  Well developed/obese in NAD Skin warm/dry Patient not depressed No peripheral clubbing Back-normal HEENT-normal/normal eyelids Neck supple/normal carotid upstroke bilaterally; no bruits; no  JVD; no thyromegaly chest - CTA/ normal expansion CV - RRR/normal S1 and S2; no murmurs, rubs or gallops;  PMI nondisplaced Abdomen -NT/ND, no HSM, no mass, + bowel sounds, no bruit 2+ femoral pulses, no bruits Ext-no edema, chords, 2+ DP Neuro-grossly nonfocal  ECG -Sinus rhythm at a rate of 79. Left axis deviation. Nonspecific ST changes.  A/P  1 Dyspnea-etiology unclear. Multiple risk factors including diabetes mellitus. Plan stress nuclear study for risk stratification.  2 tobacco abuse-patient counseled on discontinuing.   3 hypertension-blood pressure controlled. Continue present medications.   4 hyperlipidemia-continue statin. Long discussion today concerning risk factor modification and lifestyle changes.      Olga MillersBrian Leili Eskenazi, MD

## 2016-02-05 ENCOUNTER — Encounter: Payer: Self-pay | Admitting: Cardiology

## 2016-02-05 ENCOUNTER — Ambulatory Visit (INDEPENDENT_AMBULATORY_CARE_PROVIDER_SITE_OTHER): Payer: BLUE CROSS/BLUE SHIELD | Admitting: Cardiology

## 2016-02-05 VITALS — BP 128/82 | HR 79 | Ht 65.0 in | Wt 254.0 lb

## 2016-02-05 DIAGNOSIS — R0600 Dyspnea, unspecified: Secondary | ICD-10-CM

## 2016-02-05 DIAGNOSIS — I10 Essential (primary) hypertension: Secondary | ICD-10-CM | POA: Diagnosis not present

## 2016-02-05 DIAGNOSIS — E7800 Pure hypercholesterolemia, unspecified: Secondary | ICD-10-CM | POA: Diagnosis not present

## 2016-02-05 NOTE — Patient Instructions (Signed)
Medication Instructions:   NO CHANGE  Testing/Procedures:  Your physician has requested that you have en exercise stress myoview. For further information please visit www.[email or web address]. Please follow instruction sheet, as given.    Follow-Up:  Your physician recommends that you schedule a follow-up appointment in: AS NEEDED PENDING TEST RESULTS    

## 2016-02-23 ENCOUNTER — Telehealth (HOSPITAL_COMMUNITY): Payer: Self-pay

## 2016-02-23 NOTE — Telephone Encounter (Signed)
Encounter complete. 

## 2016-02-25 ENCOUNTER — Ambulatory Visit (HOSPITAL_COMMUNITY)
Admission: RE | Admit: 2016-02-25 | Discharge: 2016-02-25 | Disposition: A | Discharge status: Home / Self Care | Payer: BLUE CROSS/BLUE SHIELD | Source: Ambulatory Visit | Attending: Cardiology | Admitting: Cardiology

## 2016-02-25 ENCOUNTER — Telehealth: Payer: Self-pay | Admitting: Cardiology

## 2016-02-25 DIAGNOSIS — I10 Essential (primary) hypertension: Secondary | ICD-10-CM | POA: Insufficient documentation

## 2016-02-25 DIAGNOSIS — Z8679 Personal history of other diseases of the circulatory system: Secondary | ICD-10-CM | POA: Insufficient documentation

## 2016-02-25 DIAGNOSIS — E119 Type 2 diabetes mellitus without complications: Secondary | ICD-10-CM | POA: Insufficient documentation

## 2016-02-25 DIAGNOSIS — E785 Hyperlipidemia, unspecified: Secondary | ICD-10-CM | POA: Diagnosis not present

## 2016-02-25 DIAGNOSIS — F17200 Nicotine dependence, unspecified, uncomplicated: Secondary | ICD-10-CM | POA: Diagnosis not present

## 2016-02-25 DIAGNOSIS — R0600 Dyspnea, unspecified: Secondary | ICD-10-CM | POA: Diagnosis present

## 2016-02-25 DIAGNOSIS — E669 Obesity, unspecified: Secondary | ICD-10-CM | POA: Insufficient documentation

## 2016-02-25 LAB — MYOCARDIAL PERFUSION IMAGING
CHL CUP NUCLEAR SDS: 3
CHL CUP NUCLEAR SRS: 4
CSEPPHR: 122 {beats}/min
LV dias vol: 152 mL (ref 62–150)
LV sys vol: 60 mL
Rest HR: 66 {beats}/min
SSS: 7
TID: 1.02

## 2016-02-25 MED ORDER — TECHNETIUM TC 99M TETROFOSMIN IV KIT
10.1000 | PACK | Freq: Once | INTRAVENOUS | Status: AC | PRN
  Administered 2016-02-25: 10.1 via INTRAVENOUS
  Filled 2016-02-25: qty 11

## 2016-02-25 MED ORDER — REGADENOSON 0.4 MG/5ML IV SOLN
0.4000 mg | Freq: Once | INTRAVENOUS | Status: AC
  Administered 2016-02-25: 0.4 mg via INTRAVENOUS

## 2016-02-25 MED ORDER — TECHNETIUM TC 99M TETROFOSMIN IV KIT
1330.0000 | PACK | Freq: Once | INTRAVENOUS | Status: DC | PRN
  Filled 2016-02-25: qty 1330

## 2016-02-25 MED ORDER — TECHNETIUM TC 99M TETROFOSMIN IV KIT
32.0000 | PACK | Freq: Once | INTRAVENOUS | Status: AC | PRN
  Administered 2016-02-25: 32 via INTRAVENOUS
  Filled 2016-02-25: qty 32

## 2016-02-25 NOTE — Telephone Encounter (Signed)
Pt have Stress Test at 1:00 today,question about taking his medicine.Please call asap.

## 2017-03-14 IMAGING — NM NM MISC PROCEDURE
6 series · 36 of 36 positions shown · non-contrast
Comparison: none

[Series 1: wbr rest · 6.40mm/px · 6 of 64 frames shown]
[frame 6/64]
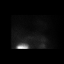
[frame 16/64]
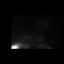
[frame 27/64]
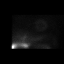
[frame 38/64]
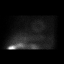
[frame 48/64]
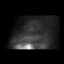
[frame 59/64]
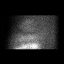

[Series 1: wbr_r-proj_st wbr rest · 6.40mm/px · 6 of 64 frames shown]
[frame 6/64]
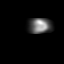
[frame 16/64]
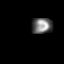
[frame 27/64]
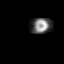
[frame 38/64]
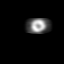
[frame 48/64]
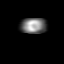
[frame 59/64]
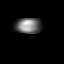

[Series 2: wbr stress-gsp · 6.40mm/px · 6 of 512 frames shown]
[frame 43/512]
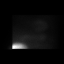
[frame 128/512]
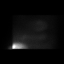
[frame 214/512]
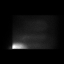
[frame 299/512]
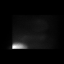
[frame 384/512]
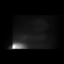
[frame 470/512]
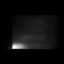

[Series 2: wbr_s-proj_st wbr stress-gsp · 6.40mm/px · 6 of 512 frames shown]
[frame 43/512]
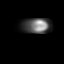
[frame 128/512]
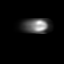
[frame 214/512]
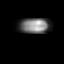
[frame 299/512]
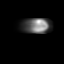
[frame 384/512]
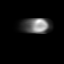
[frame 470/512]
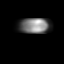

[Series 3: wbr stress-sum-em · 6.40mm/px · 6 of 64 frames shown]
[frame 6/64]
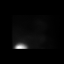
[frame 16/64]
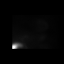
[frame 27/64]
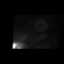
[frame 38/64]
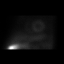
[frame 48/64]
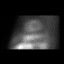
[frame 59/64]
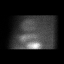

[Series 3: wbr_s-proj_st wbr stress-sum-em · 6.40mm/px · 6 of 64 frames shown]
[frame 6/64]
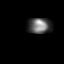
[frame 16/64]
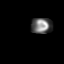
[frame 27/64]
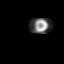
[frame 38/64]
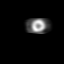
[frame 48/64]
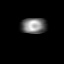
[frame 59/64]
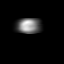

[36 of 36 positions shown; findings below may reference images not displayed]

Canned report from images found in remote index.

Refer to host system for actual result text.

## 2018-01-09 ENCOUNTER — Ambulatory Visit (HOSPITAL_COMMUNITY): Payer: BLUE CROSS/BLUE SHIELD | Admitting: Psychiatry

## 2018-04-07 ENCOUNTER — Ambulatory Visit (HOSPITAL_COMMUNITY): Payer: Self-pay | Admitting: Psychiatry

## 2018-08-23 DIAGNOSIS — F3342 Major depressive disorder, recurrent, in full remission: Secondary | ICD-10-CM | POA: Diagnosis present

## 2023-12-25 ENCOUNTER — Emergency Department (HOSPITAL_COMMUNITY)

## 2023-12-25 ENCOUNTER — Inpatient Hospital Stay (HOSPITAL_COMMUNITY)
Admission: EM | Admit: 2023-12-25 | Discharge: 2023-12-28 | DRG: 638 | Disposition: A | Discharge status: Home / Self Care | Attending: Internal Medicine | Admitting: Internal Medicine

## 2023-12-25 ENCOUNTER — Encounter (HOSPITAL_COMMUNITY): Payer: Self-pay

## 2023-12-25 ENCOUNTER — Other Ambulatory Visit: Payer: Self-pay

## 2023-12-25 DIAGNOSIS — E1122 Type 2 diabetes mellitus with diabetic chronic kidney disease: Secondary | ICD-10-CM | POA: Diagnosis present

## 2023-12-25 DIAGNOSIS — E1169 Type 2 diabetes mellitus with other specified complication: Secondary | ICD-10-CM | POA: Diagnosis present

## 2023-12-25 DIAGNOSIS — M86141 Other acute osteomyelitis, right hand: Secondary | ICD-10-CM | POA: Diagnosis present

## 2023-12-25 DIAGNOSIS — E1152 Type 2 diabetes mellitus with diabetic peripheral angiopathy with gangrene: Principal | ICD-10-CM | POA: Diagnosis present

## 2023-12-25 DIAGNOSIS — E11621 Type 2 diabetes mellitus with foot ulcer: Secondary | ICD-10-CM

## 2023-12-25 DIAGNOSIS — D631 Anemia in chronic kidney disease: Secondary | ICD-10-CM | POA: Diagnosis present

## 2023-12-25 DIAGNOSIS — N1832 Chronic kidney disease, stage 3b: Secondary | ICD-10-CM | POA: Diagnosis present

## 2023-12-25 DIAGNOSIS — M869 Osteomyelitis, unspecified: Principal | ICD-10-CM | POA: Diagnosis present

## 2023-12-25 DIAGNOSIS — Z8249 Family history of ischemic heart disease and other diseases of the circulatory system: Secondary | ICD-10-CM

## 2023-12-25 DIAGNOSIS — Z7984 Long term (current) use of oral hypoglycemic drugs: Secondary | ICD-10-CM

## 2023-12-25 DIAGNOSIS — F411 Generalized anxiety disorder: Secondary | ICD-10-CM | POA: Diagnosis present

## 2023-12-25 DIAGNOSIS — E871 Hypo-osmolality and hyponatremia: Secondary | ICD-10-CM | POA: Diagnosis present

## 2023-12-25 DIAGNOSIS — K760 Fatty (change of) liver, not elsewhere classified: Secondary | ICD-10-CM | POA: Diagnosis present

## 2023-12-25 DIAGNOSIS — I129 Hypertensive chronic kidney disease with stage 1 through stage 4 chronic kidney disease, or unspecified chronic kidney disease: Secondary | ICD-10-CM | POA: Diagnosis present

## 2023-12-25 DIAGNOSIS — L97509 Non-pressure chronic ulcer of other part of unspecified foot with unspecified severity: Secondary | ICD-10-CM | POA: Diagnosis not present

## 2023-12-25 DIAGNOSIS — Z7985 Long-term (current) use of injectable non-insulin antidiabetic drugs: Secondary | ICD-10-CM | POA: Diagnosis not present

## 2023-12-25 DIAGNOSIS — E119 Type 2 diabetes mellitus without complications: Secondary | ICD-10-CM

## 2023-12-25 DIAGNOSIS — E66813 Obesity, class 3: Secondary | ICD-10-CM | POA: Diagnosis present

## 2023-12-25 DIAGNOSIS — E7800 Pure hypercholesterolemia, unspecified: Secondary | ICD-10-CM | POA: Diagnosis present

## 2023-12-25 DIAGNOSIS — Z79899 Other long term (current) drug therapy: Secondary | ICD-10-CM | POA: Diagnosis not present

## 2023-12-25 DIAGNOSIS — Z6841 Body Mass Index (BMI) 40.0 and over, adult: Secondary | ICD-10-CM

## 2023-12-25 DIAGNOSIS — I10 Essential (primary) hypertension: Secondary | ICD-10-CM | POA: Diagnosis present

## 2023-12-25 DIAGNOSIS — L98A316 Non-pressure chronic ulcer of right hand with bone involvement without evidence of necrosis: Secondary | ICD-10-CM | POA: Diagnosis present

## 2023-12-25 DIAGNOSIS — F17200 Nicotine dependence, unspecified, uncomplicated: Secondary | ICD-10-CM | POA: Diagnosis present

## 2023-12-25 DIAGNOSIS — Z96659 Presence of unspecified artificial knee joint: Secondary | ICD-10-CM | POA: Diagnosis present

## 2023-12-25 DIAGNOSIS — K219 Gastro-esophageal reflux disease without esophagitis: Secondary | ICD-10-CM | POA: Diagnosis present

## 2023-12-25 DIAGNOSIS — E860 Dehydration: Secondary | ICD-10-CM | POA: Diagnosis present

## 2023-12-25 DIAGNOSIS — Z880 Allergy status to penicillin: Secondary | ICD-10-CM | POA: Diagnosis not present

## 2023-12-25 DIAGNOSIS — Z23 Encounter for immunization: Secondary | ICD-10-CM

## 2023-12-25 DIAGNOSIS — F3342 Major depressive disorder, recurrent, in full remission: Secondary | ICD-10-CM | POA: Diagnosis present

## 2023-12-25 DIAGNOSIS — M109 Gout, unspecified: Secondary | ICD-10-CM | POA: Diagnosis present

## 2023-12-25 DIAGNOSIS — G894 Chronic pain syndrome: Secondary | ICD-10-CM | POA: Diagnosis not present

## 2023-12-25 DIAGNOSIS — M79644 Pain in right finger(s): Secondary | ICD-10-CM | POA: Diagnosis present

## 2023-12-25 DIAGNOSIS — N179 Acute kidney failure, unspecified: Secondary | ICD-10-CM

## 2023-12-25 LAB — BASIC METABOLIC PANEL WITH GFR
Anion gap: 12 (ref 5–15)
BUN: 23 mg/dL (ref 8–23)
CO2: 25 mmol/L (ref 22–32)
Calcium: 9.4 mg/dL (ref 8.9–10.3)
Chloride: 97 mmol/L — ABNORMAL LOW (ref 98–111)
Creatinine, Ser: 1.74 mg/dL — ABNORMAL HIGH (ref 0.61–1.24)
GFR, Estimated: 42 mL/min — ABNORMAL LOW (ref >60)
Glucose, Bld: 132 mg/dL — ABNORMAL HIGH (ref 70–99)
Potassium: 5.3 mmol/L — ABNORMAL HIGH (ref 3.5–5.1)
Sodium: 134 mmol/L — ABNORMAL LOW (ref 135–145)

## 2023-12-25 LAB — CBC WITH DIFFERENTIAL/PLATELET
Abs Immature Granulocytes: 0.24 K/uL — ABNORMAL HIGH (ref 0.00–0.07)
Basophils Absolute: 0.1 K/uL (ref 0.0–0.1)
Basophils Relative: 1 %
Eosinophils Absolute: 0.1 K/uL (ref 0.0–0.5)
Eosinophils Relative: 1 %
HCT: 30 % — ABNORMAL LOW (ref 39.0–52.0)
Hemoglobin: 9.4 g/dL — ABNORMAL LOW (ref 13.0–17.0)
Immature Granulocytes: 2 %
Lymphocytes Relative: 21 %
Lymphs Abs: 2.1 K/uL (ref 0.7–4.0)
MCH: 26.9 pg (ref 26.0–34.0)
MCHC: 31.3 g/dL (ref 30.0–36.0)
MCV: 86 fL (ref 80.0–100.0)
Monocytes Absolute: 0.6 K/uL (ref 0.1–1.0)
Monocytes Relative: 6 %
Neutro Abs: 7 K/uL (ref 1.7–7.7)
Neutrophils Relative %: 69 %
Platelets: 581 K/uL — ABNORMAL HIGH (ref 150–400)
RBC: 3.49 MIL/uL — ABNORMAL LOW (ref 4.22–5.81)
RDW: 15.9 % — ABNORMAL HIGH (ref 11.5–15.5)
WBC: 10.1 K/uL (ref 4.0–10.5)
nRBC: 0 % (ref 0.0–0.2)

## 2023-12-25 LAB — GLUCOSE, CAPILLARY: Glucose-Capillary: 82 mg/dL (ref 70–99)

## 2023-12-25 MED ORDER — POLYETHYLENE GLYCOL 3350 17 G PO PACK: 17.0000 g | PACK | Freq: Every day | ORAL | Status: DC | PRN

## 2023-12-25 MED ORDER — PANTOPRAZOLE SODIUM 40 MG PO TBEC
40.0000 mg | DELAYED_RELEASE_TABLET | Freq: Every day | ORAL | Status: DC
  Administered 2023-12-26 – 2023-12-28 (×3): 40 mg via ORAL
  Filled 2023-12-25 (×3): qty 1

## 2023-12-25 MED ORDER — VALSARTAN-HYDROCHLOROTHIAZIDE 320-25 MG PO TABS
1.0000 | ORAL_TABLET | Freq: Every day | ORAL | Status: DC
Start: 2023-12-26 — End: 2023-12-25

## 2023-12-25 MED ORDER — ACETAMINOPHEN 325 MG PO TABS: 650.0000 mg | ORAL_TABLET | Freq: Four times a day (QID) | ORAL | Status: DC | PRN

## 2023-12-25 MED ORDER — ACETAMINOPHEN 650 MG RE SUPP: 650.0000 mg | Freq: Four times a day (QID) | RECTAL | Status: DC | PRN

## 2023-12-25 MED ORDER — DOXEPIN HCL 10 MG PO CAPS
10.0000 mg | ORAL_CAPSULE | Freq: Every day | ORAL | Status: DC
  Administered 2023-12-25 – 2023-12-27 (×3): 10 mg via ORAL
  Filled 2023-12-25 (×5): qty 1

## 2023-12-25 MED ORDER — IRBESARTAN 300 MG PO TABS
300.0000 mg | ORAL_TABLET | Freq: Every day | ORAL | Status: DC
  Administered 2023-12-26 – 2023-12-28 (×3): 300 mg via ORAL
  Filled 2023-12-25 (×3): qty 1

## 2023-12-25 MED ORDER — METHADONE HCL 10 MG PO TABS
10.0000 mg | ORAL_TABLET | Freq: Three times a day (TID) | ORAL | Status: DC
Start: 2023-12-25 — End: 2023-12-28
  Administered 2023-12-26 – 2023-12-28 (×8): 10 mg via ORAL
  Filled 2023-12-25 (×8): qty 1

## 2023-12-25 MED ORDER — AMLODIPINE BESYLATE 5 MG PO TABS
10.0000 mg | ORAL_TABLET | Freq: Every day | ORAL | Status: DC
  Administered 2023-12-26 – 2023-12-28 (×3): 10 mg via ORAL
  Filled 2023-12-25 (×3): qty 2

## 2023-12-25 MED ORDER — VANCOMYCIN HCL 1500 MG/300ML IV SOLN
1500.0000 mg | Freq: Once | INTRAVENOUS | Status: DC
  Filled 2023-12-25: qty 300

## 2023-12-25 MED ORDER — SODIUM CHLORIDE 0.9% FLUSH
3.0000 mL | Freq: Two times a day (BID) | INTRAVENOUS | Status: DC
  Administered 2023-12-25 – 2023-12-28 (×6): 3 mL via INTRAVENOUS

## 2023-12-25 MED ORDER — MORPHINE SULFATE (PF) 4 MG/ML IV SOLN
4.0000 mg | Freq: Once | INTRAVENOUS | Status: AC
  Administered 2023-12-25: 4 mg via INTRAVENOUS
  Filled 2023-12-25: qty 1

## 2023-12-25 MED ORDER — METHADONE HCL 10 MG PO TABS: 10.0000 mg | ORAL_TABLET | Freq: Every day | ORAL | Status: DC

## 2023-12-25 MED ORDER — FENOFIBRATE 160 MG PO TABS
160.0000 mg | ORAL_TABLET | Freq: Every day | ORAL | Status: DC
  Administered 2023-12-26 – 2023-12-28 (×3): 160 mg via ORAL
  Filled 2023-12-25 (×3): qty 1

## 2023-12-25 MED ORDER — VANCOMYCIN HCL 1500 MG/300ML IV SOLN
1500.0000 mg | Freq: Once | INTRAVENOUS | Status: AC
  Administered 2023-12-25: 1500 mg via INTRAVENOUS
  Filled 2023-12-25 (×2): qty 300

## 2023-12-25 MED ORDER — FAMOTIDINE 20 MG PO TABS
20.0000 mg | ORAL_TABLET | Freq: Every day | ORAL | Status: DC
  Administered 2023-12-26 – 2023-12-28 (×3): 20 mg via ORAL
  Filled 2023-12-25 (×3): qty 1

## 2023-12-25 MED ORDER — SODIUM CHLORIDE 0.9 % IV BOLUS
1000.0000 mL | Freq: Once | INTRAVENOUS | Status: AC
  Administered 2023-12-25: 1000 mL via INTRAVENOUS

## 2023-12-25 MED ORDER — SODIUM CHLORIDE 0.9 % IV SOLN
2.0000 g | Freq: Two times a day (BID) | INTRAVENOUS | Status: DC
Start: 2023-12-25 — End: 2023-12-27
  Administered 2023-12-25 – 2023-12-27 (×4): 2 g via INTRAVENOUS
  Filled 2023-12-25 (×4): qty 12.5

## 2023-12-25 MED ORDER — HYDROCHLOROTHIAZIDE 25 MG PO TABS
25.0000 mg | ORAL_TABLET | Freq: Every day | ORAL | Status: DC
  Administered 2023-12-26 – 2023-12-28 (×3): 25 mg via ORAL
  Filled 2023-12-25 (×3): qty 1

## 2023-12-25 MED ORDER — INSULIN ASPART 100 UNIT/ML IJ SOLN
0.0000 [IU] | Freq: Three times a day (TID) | INTRAMUSCULAR | Status: DC
  Administered 2023-12-26 – 2023-12-27 (×2): 1 [IU] via SUBCUTANEOUS
  Administered 2023-12-27: 2 [IU] via SUBCUTANEOUS
  Administered 2023-12-27: 1 [IU] via SUBCUTANEOUS
  Administered 2023-12-28: 2 [IU] via SUBCUTANEOUS
  Administered 2023-12-28: 1 [IU] via SUBCUTANEOUS

## 2023-12-25 MED ORDER — DULOXETINE HCL 30 MG PO CPEP
60.0000 mg | ORAL_CAPSULE | Freq: Two times a day (BID) | ORAL | Status: DC
  Administered 2023-12-25 – 2023-12-28 (×6): 60 mg via ORAL
  Filled 2023-12-25 (×6): qty 2

## 2023-12-25 MED ORDER — OXYCODONE-ACETAMINOPHEN 5-325 MG PO TABS
1.0000 | ORAL_TABLET | Freq: Four times a day (QID) | ORAL | Status: DC | PRN
  Administered 2023-12-25 – 2023-12-28 (×5): 1 via ORAL
  Filled 2023-12-25 (×5): qty 1

## 2023-12-25 MED ORDER — VANCOMYCIN HCL IN DEXTROSE 1-5 GM/200ML-% IV SOLN: 1000.0000 mg | Freq: Once | INTRAVENOUS | Status: DC

## 2023-12-25 MED ORDER — PIPERACILLIN-TAZOBACTAM 3.375 G IVPB
3.3750 g | Freq: Once | INTRAVENOUS | Status: DC
  Administered 2023-12-25: 3.375 g via INTRAVENOUS
  Filled 2023-12-25: qty 50

## 2023-12-25 MED ORDER — BUPROPION HCL 100 MG PO TABS
100.0000 mg | ORAL_TABLET | Freq: Two times a day (BID) | ORAL | Status: DC
  Administered 2023-12-25 – 2023-12-28 (×6): 100 mg via ORAL
  Filled 2023-12-25 (×9): qty 1

## 2023-12-25 MED ORDER — VANCOMYCIN HCL 1250 MG/250ML IV SOLN: 1250.0000 mg | INTRAVENOUS | Status: DC

## 2023-12-25 NOTE — ED Provider Notes (Signed)
 [place] EMERGENCY DEPARTMENT AT [facility] Provider Note   CSN: [id number] Arrival date & time: 12/25/23  1457     Patient presents with: No chief complaint on file.   Jeffrey [name] is a 68 y.o. male history of hypertension, diabetes here presenting with osteomyelitis of the right third finger.  Patient states that he has an ulcer of the right third finger that is present for about a month now.  He states that it is progressively having purulent drainage and he has worsening pain.  He also has redness of the right third finger.  He talked to his doctor and was referred to see hand surgery.  He saw Dr. Larayne today in the office who sent patient in for IV antibiotics.    The history is provided by the patient.       Prior to Admission medications   Medication Sig Start Date End Date Taking? Authorizing Provider  ALPRAZolam (XANAX) 0.5 MG tablet Take 0.5 mg by mouth 3 (three) times daily as needed for anxiety.    [provider]  amLODipine (NORVASC) 10 MG tablet Take 10 mg by mouth daily.    [provider]  buPROPion (WELLBUTRIN) 100 MG tablet Take 100 mg by mouth 2 (two) times daily.    [provider]  doxepin (SINEQUAN) 10 MG capsule Take 10 mg by mouth at bedtime.    [provider]  DULoxetine (CYMBALTA) 60 MG capsule Take 60 mg by mouth 2 (two) times daily.    [provider]  famotidine (PEPCID) 20 MG tablet Take 20 mg by mouth daily.    [provider]  fenofibrate (TRICOR) 145 MG tablet Take 145 mg by mouth daily.    [provider]  HYDROcodone-acetaminophen (NORCO) 10-325 MG tablet Take 1 tablet by mouth daily as needed. 01/15/16   [provider]  Insulin Glargine (LANTUS SOLOSTAR [place]) Inject 35 Units into the skin every evening.    [provider]  metFORMIN (GLUCOPHAGE) 500 MG tablet Take 1,000 mg by mouth 2 (two) times daily with a meal.    [provider]   methadone (DOLOPHINE) 10 MG tablet Take 30 mg by mouth 2 (two) times daily.    [provider]  omeprazole (PRILOSEC) 40 MG capsule Take 40 mg by mouth 2 (two) times daily.    [provider]  rosuvastatin (CRESTOR) 20 MG tablet Take 20 mg by mouth daily.    [provider]  tiZANidine (ZANAFLEX) 4 MG tablet Take 4 mg by mouth every 8 (eight) hours as needed for muscle spasms.    [provider]  valsartan-hydrochlorothiazide (DIOVAN-HCT) 320-25 MG tablet Take 1 tablet by mouth daily.    [provider]    Allergies: Penicillins    Review of Systems  Musculoskeletal:        Right third finger pain  All other systems reviewed and are negative.   Updated Vital Signs BP 138/77 (BP Location: Right Arm)   Pulse (!) 111   Temp 99.2 F (37.3 C) (Oral)   Resp 18   Ht 5' 5 (1.651 m)   Wt 99.8 kg   SpO2 100%   BMI 36.61 kg/m   Physical Exam Vitals and nursing note reviewed.  HENT:     Head: Normocephalic.     Nose: Nose normal.     Mouth/Throat:     Mouth: Mucous membranes are moist.  Eyes:     Pupils: Pupils are equal,  round, and reactive to light.  Cardiovascular:     Rate and Rhythm: Normal rate.     Pulses: Normal pulses.  Pulmonary:     Effort: Pulmonary effort is normal.  Abdominal:     General: Abdomen is flat.  Musculoskeletal:     Cervical back: Normal range of motion.     Comments: Patient has ulcer on the right third finger.  There is surrounding cellulitis.  There appears to be purulent discharge  Skin:    Capillary Refill: Capillary refill takes less than 2 seconds.  Neurological:     General: No focal deficit present.     Mental Status: He is alert and oriented to person, place, and time.  Psychiatric:        Mood and Affect: Mood normal.        Behavior: Behavior normal.     (all labs ordered are listed, but only abnormal results are displayed) Labs Reviewed  CBC WITH DIFFERENTIAL/PLATELET - Abnormal;  Notable for the following components:      Result Value   RBC 3.49 (*)    Hemoglobin 9.4 (*)    HCT 30.0 (*)    RDW 15.9 (*)    Platelets 581 (*)    Abs Immature Granulocytes 0.24 (*)    All other components within normal limits  BASIC METABOLIC PANEL WITH GFR - Abnormal; Notable for the following components:   Sodium 134 (*)    Potassium 5.3 (*)    Chloride 97 (*)    Glucose, Bld 132 (*)    Creatinine, Ser 1.74 (*)    GFR, Estimated 42 (*)    All other components within normal limits    EKG: None  Radiology: DG Finger Middle Right Result Date: 12/25/2023 CLINICAL DATA:  Suspected osteomyelitis of the third right finger. EXAM: RIGHT MIDDLE FINGER 2+V COMPARISON:  None Available. FINDINGS: No evidence of an acute fracture or dislocation. There is cortical destruction of the tuft of the distal phalanx of the third right finger. Or small foci of adjacent soft tissue air are seen. Diffuse soft tissue swelling and a distal soft tissue defect are also noted. IMPRESSION: Findings consistent with acute osteomyelitis involving the tuft of the distal phalanx of the third right finger. Electronically Signed   By: Suzen Dials M.D.   On: 12/25/2023 16:15     Procedures   CRITICAL CARE Performed by: Alm VEAR Cave   Total critical care time: 39 minutes  Critical care time was exclusive of separately billable procedures and treating other patients.  Critical care was necessary to treat or prevent imminent or life-threatening deterioration.  Critical care was time spent personally by me on the following activities: development of treatment plan with patient and/or surrogate as well as nursing, discussions with consultants, evaluation of patient's response to treatment, examination of patient, obtaining history from patient or surrogate, ordering and performing treatments and interventions, ordering and review of laboratory studies, ordering and review of radiographic studies, pulse  oximetry and re-evaluation of patient's condition.   Medications Ordered in the ED  morphine (PF) 4 MG/ML injection 4 mg (has no administration in time range)  vancomycin (VANCOREADY) IVPB 1500 mg/300 mL (has no administration in time range)  piperacillin-tazobactam (ZOSYN) IVPB 3.375 g (has no administration in time range)  Medical Decision Making Jeffrey [name] is a 68 y.o. male here presenting with right third finger osteomyelitis.  Patient appears also to have dry gangrene of the right third finger.  I discussed with Dr. Larayne.  He recommend IV antibiotics and he will round on the patient tomorrow.  He states that patient can eat and does not need to be n.p.o. for now.  He states that he will wait several days and if I have antibiotics does not improve, then he will perform amputation.  4:53 PM Reviewed patient's labs and white blood cell count is normal.  Patient had mild AKI with creatinine 1.7.  X-ray showed osteomyelitis of the right third finger.  Ordered Vanco and Zosyn.  At this point patient will be admitted   Problems Addressed: AKI (acute kidney injury): acute illness or injury Finger osteomyelitis (HCC): acute illness or injury  Amount and/or Complexity of Data Reviewed Labs: ordered. Decision-making details documented in ED Course. Radiology: ordered and independent interpretation performed. Decision-making details documented in ED Course.  Risk Prescription drug management.     Final diagnoses:  None    ED Discharge Orders     None          Patt Alm Macho, MD 12/25/23 1655

## 2023-12-25 NOTE — ED Provider Triage Note (Signed)
 Emergency Medicine Provider Triage Evaluation Note  Jeffrey [name] , a 68 y.o. male  was evaluated in triage.  Pt complains of right long digit infection.  Patient seen by orthopedic hand surgery today, Dr. Delene, sent to the emergency department due to acute osteomyelitis and for further treatment.  Patient reports wound about 1-2 months ago.  Symptoms have been progressive, worsened about a week ago with some discharge.  No fevers.  Ortho note recommendations:  1. Osteomyelitis of the right long finger - worsening, threat to normal function  - recommend admission to hospital for IV antibiotics and local wound care - Infection has penetrated to the bone level, requiring immediate treatment -no evidence of flexor tenosynovitis, no emergent surgical intervention required. - Primary treatment: intravenous antibiotics,  - Oral antibiotics may not suffice;  - If inadequate response to non-operative treatment then, surgical intervention may be required - Potential surgical intervention: partial amputation of the finger - Advised to seek immediate medical attention at the emergency room for admission and initiation of IV antibiotics - Perform Dial soap soaks three times daily to prevent further infection and aid in skin healing   Review of Systems  Positive: Right long digit infection, osteomyelitis Negative: Fevers  Physical Exam  BP 138/77 (BP Location: Right Arm)   Pulse (!) 111   Temp 99.2 F (37.3 C) (Oral)   Resp 18   Ht 5' 5 (1.651 m)   Wt 99.8 kg   SpO2 100%   BMI 36.61 kg/m  Gen:   Awake, no distress   Resp:  Normal effort  MSK:   Moves extremities without difficulty  Other:  Erosion of the tip of the right long digit, eschar noted, no active drainage  Medical Decision Making  Medically screening exam initiated at 3:15 PM.  Appropriate orders placed.  Jeffrey [name] was informed that the remainder of the evaluation will be completed by another provider, this  initial triage assessment does not replace that evaluation, and the importance of remaining in the ED until their evaluation is complete.     Desiderio Chew, PA-C 12/25/23 1517

## 2023-12-25 NOTE — Progress Notes (Signed)
 Pharmacy Antibiotic Note  Jeffrey [name] is a 68 y.o. male for which pharmacy has been consulted for cefepime and vancomycin dosing for osteomyelitis.  Patient with a history of HTN, HLD, GERD, DM, gout, obesity. Patient presenting with infected right third digit .  SCr 1.74 - trending down from earlier this month WBC 10.1; T 99.2; HR 111; RR 18  Zosyn given x 1 in ED. PCN allergy noted in chart but has been tolerating ok.  Plan: Cefepime 2g q12hr  Vancomycin 1500 mg once then 1500 mg q36hr (eAUC 495) unless change in renal function Monitor WBC, fever, renal function, cultures De-escalate when able Levels at steady state  Height: 5' 5 (165.1 cm) Weight: 99.8 kg (220 lb) IBW/kg (Calculated) : 61.5  Temp (24hrs), Avg:99.2 F (37.3 C), Min:99.2 F (37.3 C), Max:99.2 F (37.3 C)  Recent Labs  Lab 12/25/23 1544  WBC 10.1  CREATININE 1.74*    Estimated Creatinine Clearance: 44.1 mL/min (A) (by C-G formula based on SCr of 1.74 mg/dL (H)).    Allergies  Allergen Reactions   Penicillins    Microbiology results: Pending  Thank you for allowing pharmacy to be a part of this patient's care.  Dorn Buttner, PharmD, BCPS 12/25/2023 5:32 PM ED Clinical Pharmacist -  [phone]

## 2023-12-25 NOTE — Progress Notes (Signed)
 Orthopaedic Surgery Hand and Upper Extremity History and Physical Examination  CC: RLF infection  HPI 12/25/2023: History of Present Illness The patient, a 68 year old male, presents with an infection of the right long finger.  He reports a malodorous discharge from the affected finger, expressing concern about potential necrosis. The infection onset was approximately 2 months ago, following a suspected cut or burn. The condition has progressively deteriorated. He experiences paresthesia at the fingertip and is unable to achieve full extension of the digit. Additionally, the patient recounts a recent hospitalization due to an adverse reaction to Ozempic, which resulted in persistent vomiting for 4 days and subsequent dehydration. During this hospital admission, the finger was not evaluated. He had a scheduled appointment last week but was unable to attend due to occupational obligations.  MEDICATIONS Current: Ozempic   Problem List:  Problem List[1]  Past Medical History: Medical History[2]   Medications: Current Rx ordered in [facility][3]  Allergies: Allergies as of 12/25/2023 - Reviewed 12/25/2023  Allergen Reaction Noted  . Penicillins Swelling     Past Surgical History: Surgical History[4]   Social History: Social History   Occupational History  . Occupation: Works Part-Time  Tobacco Use  . Smoking status: Former  . Smokeless tobacco: Never  Vaping Use  . Vaping status: Never Used  Substance and Sexual Activity  . Alcohol use: Not on file  . Drug use: Never  . Sexual activity: Yes    Partners: Female     Family History: Family History[5] Otherwise, no relevant orthopaedic family history  ROS: Review of Systems: All systems reviewed and are negative except that mentioned in HPI  Work/Sport/Hobbies: See HPI  Physical Examination: Vitals:   12/25/23 0917  BP: 132/72  Pulse: 88  Temp: 97.2 F (36.2 C)   Constitutional: Awake, alert.   WN/WD Appearance: healthy, no acute distress, well-groomed Affect: Normal HEENT: EOMI, mucous membranes moist CV: RRR Pulm: breathing comfortably  Right Upper Extremity / Hand Physical Exam On examination of the right upper extremity, specifically the long finger, there is an eschar at the distal tip. Swelling and erythema are present at the distal tip. It is nontender distally and along the flexor tendon sheath. Sensation is intact on the ulnar side, but not on the radial side of the distal tip.        Results: Results     Assessment/Plan:  Assessment & Plan 1. Osteomyelitis of the right long finger - worsening, threat to normal function  - recommend admission to hospital for IV antibiotics and local wound care - Infection has penetrated to the bone level, requiring immediate treatment -no evidence of flexor tenosynovitis, no emergent surgical intervention required. - Primary treatment: intravenous antibiotics,  - Oral antibiotics may not suffice;  - If inadequate response to non-operative treatment then, surgical intervention may be required - Potential surgical intervention: partial amputation of the finger - Advised to seek immediate medical attention at the emergency room for admission and initiation of IV antibiotics - Perform Dial soap soaks three times daily to prevent further infection and aid in skin healing     Marsa HERO. Chiaramonti, MD Hand and Upper Extremity Surgery [facility] Department of Orthopaedic Surgery [facility] 12/25/2023 9:51 AM       [1] Patient Active Problem List Diagnosis  . Diabetes mellitus with proteinuria (HCC)  . GAD (generalized anxiety disorder)  . Hyperlipidemia LDL goal <100  . Essential hypertension  . Low back pain  .  Lumbar disc herniation with radiculopathy  . High risk medication use  . Lumbar facet arthropathy  . Morbid obesity with BMI of 40.0-44.9, adult  (HCC)  . NAFLD (nonalcoholic fatty liver disease)  . Tobacco dependence  . Lumbar spinal stenosis  . Organic impotence  . Insomnia  . Esophageal reflux  . PAC (premature atrial contraction)  . Elevated TSH  . Recurrent major depressive disorder, in full remission (HCC)  . Acute upper respiratory infections of unspecified site  . Allergic rhinitis  . Gout  . Pain in joint, lower leg  . Dysthymic disorder  . Generalized anxiety disorder  . Essential hypertension, benign  . Hypercholesterolemia  . Hyperlipidemia  . Acute cough  . Head congestion  . Sinus pain  . Severe obesity (BMI 35.0-39.9) with comorbidity (CMS/HCC)  [2] Past Medical History: Diagnosis Date  . Cataract   . Cervicalgia   . Derangement of posterior horn of medial meniscus of left knee   . Diabetes mellitus    (CMD)   . Elevated AST (SGOT)   . Hypertension   . Impingement syndrome of right shoulder   . Stress fracture    Tibia/or fibula  [3] Meds Ordered in [facility]  Medication Sig Dispense Refill  . amLODIPine (NORVASC) 10 mg tablet TAKE 1 TABLET BY MOUTH EVERY DAY 90 tablet 3  . aspirin 81 mg EC tablet Take 81 mg by mouth.    . [Paused] atorvastatin (LIPITOR) 80 mg tablet TAKE 1 TABLET BY MOUTH EVERY DAY (Patient not taking: Reported on 12/19/2023) 90 tablet 3  . baclofen (LIORESAL) 5 mg tablet Take 5 mg by mouth 2 (two) times a day.    . BD Ultra-Fine Mini Pen Needle 31 gauge x 3/16 ndle USE TO INJECT INSULIN ONCE DAILY 100 each 3  . blood-glucose meter (OneTouch Verio Reflect Meter) misc USE AS DIRECTED TO TEST 3 TIMES A DAY 1 each 0  . buPROPion (WELLBUTRIN) 100 mg tablet Take 1 tablet (100 mg total) by mouth 2 (two) times a day. 180 tablet 1  . colchicine 0.6 mg tablet Take 1 tablet (0.6 mg total) by mouth 3 (three) times a day as needed for muscle/joint pain. 30 tablet 0  . doxazosin (CARDURA) 1 mg tablet TAKE 1 TABLET BY MOUTH EVERY DAY 90 tablet 1  . doxepin (SINEquan) 10 mg capsule TAKE 1  CAPSULE (10 MG TOTAL) BY MOUTH NIGHTLY. 90 capsule 1  . DULoxetine (CYMBALTA) 60 mg capsule TAKE 1 CAPSULE BY MOUTH 2 TIMES A DAY. 180 capsule 1  . ergocalciferol, vitamin D2, 50 mcg (2,000 unit) cap Take 1 capsule by mouth Once Daily.    SABRA ezetimibe (ZETIA) 10 mg tablet TAKE 1 TABLET BY MOUTH EVERY DAY 90 tablet 2  . [Paused] famotidine (PEPCID) 20 mg tablet TAKE 1 TABLET BY MOUTH EVERY DAY (Patient not taking: Reported on 12/19/2023) 90 tablet 3  . fenofibrate nanocrystallized (TRICOR) 145 mg tablet TAKE 1 TABLET BY MOUTH EVERY DAY 90 tablet 3  . glucose blood (OneTouch Ultra Test) test strip TEST BLOOD SUGAR ONCE DAILY 100 strip 3  . HYDROcodone-acetaminophen (NORCO) 10-325 mg per tablet Take 1 tablet by mouth every 6 (six) hours as needed (pain) for up to 32 days. 70 tablet 0  . hydrocortisone 2.5 % cream APPLY TO AFFECTED AREA TWICE A DAY 28 g 1  . lancets 30 gauge misc 1 Stick by miscellaneous route 3 (three) times a day. 300 each 3  . [Paused] metFORMIN (GLUCOPHAGE) 500 mg  tablet TAKE 2 TABLETS (1,000 MG TOTAL) BY MOUTH IN THE MORNING AND IN THE EVENING WITH MEALS (Patient not taking: Reported on 12/19/2023) 360 tablet 1  . methadone (DOLOPHINE) 10 mg tablet Take 1 tab po qd. 5 tablet 0  . nystatin (MYCOSTATIN) 100,000 unit/gram powder APPLY TWICE DAILY TO AFFECTED AREA FOR 2-3 WEEKS 60 g 3  . omeprazole (PriLOSEC) 40 mg DR capsule TAKE 1 CAPSULE BY MOUTH TWICE A DAY 180 capsule 3  . OneTouch Verio test strips test strip TEST 3 TIMES DAILY TO CHECK SUGARS. DX: E11.29 300 strip 3  . oxyCODONE-acetaminophen (PERCOCET) 5-325 mg per tablet Take 1 tablet by mouth every 6 (six) hours as needed for moderate pain (4-6).    SABRA rOPINIRole (REQUIP) 0.5 mg tablet Take 0.5 mg by mouth 3 (three) times a day.    . [Paused] semaglutide (Ozempic) 1 mg/dose (4 mg/3 mL) subcutaneous pen injector Inject 1 mg under the skin every 7 days. (Patient not taking: Reported on 12/19/2023) 3 mL 0  . [Paused]  valsartan-hydroCHLOROthiazide (DIOVAN HCT) 320-25 mg per tablet TAKE 1 TABLET BY MOUTH EVERY DAY (Patient not taking: Reported on 12/19/2023) 90 tablet 3   No current Epic-ordered facility-administered medications on file.  [4] History reviewed. No pertinent surgical history. [5] Family History Problem Relation Name Age of Onset  . Hypertension Mother    . Diabetes Mother    . Hypertension Father    . Diabetes Father    . Hypertension Brother    . Coronary artery disease Brother    . Glaucoma Neg Hx    . Macular degeneration Neg Hx

## 2023-12-25 NOTE — H&P (Signed)
 History and Physical   [name] FMW:[id number] DOB: 1955-05-19 DOA: 12/25/2023  PCP: Jame Maude FALCON, [name] (Inactive)   Patient coming from: Home/hand surgery office  Chief Complaint: Osteomyelitis/ulcer  HPI: Jeffrey [name] is a 68 y.o. male with medical history significant of hypertension, hyperlipidemia, GERD, diabetes, gout, anxiety, depression, obesity, nonalcoholic fatty liver disease, lumbar spine disease presenting with infected right third digit.  Patient has had an ulcer on his right third digit for about a month.  Has noted worsening purulent drainage, pain, redness.  We did see his PCP who referred him to hand surgery who saw the patient in office today and subsequently sent patient to the ED for IV antibiotics and admission.  Patient denies fevers, chills, chest pain, shortness of breath, abdominal pain, constipation, diarrhea, nausea, vomiting.  ED Course: Vital signs in the ED notable for heart rate in the 110s.  Lab workup included BMP with sodium 134, potassium 5.3, chloride 97, creatinine stable at 1.7, glucose 132.  CBC with hemoglobin 9.4 which is near baseline, platelets 181.  Right third digit x-ray showed changes consistent with acute osteomyelitis.  Patient received morphine, vancomycin, Zosyn in the ED.  EDP discussed with hand surgeon who sent patient into the hospital who plans for a couple days of IV antibiotics prior to surgery.   Review of Systems: As per HPI otherwise all other systems reviewed and are negative.  Past Medical History:  Diagnosis Date   DM (diabetes mellitus) (HCC)    Hyperlipidemia    Hypertension    PAC (premature atrial contraction) 10/08/2015   Proteinuria     Past Surgical History:  Procedure Laterality Date   APPENDECTOMY     BACK SURGERY     REPLACEMENT TOTAL KNEE      Social History  reports that he has been smoking. He does not have any smokeless tobacco history on file. He reports that he does not drink  alcohol. No history on file for drug use.  Allergies  Allergen Reactions   Penicillins     Family History  Problem Relation Age of Onset   Aortic stenosis Mother    CAD Brother   Reviewed on admission  Prior to Admission medications   Medication Sig Start Date End Date Taking? Authorizing Provider  ALPRAZolam (XANAX) 0.5 MG tablet Take 0.5 mg by mouth 3 (three) times daily as needed for anxiety.    Provider, Historical, [name]  amLODipine (NORVASC) 10 MG tablet Take 10 mg by mouth daily.    Provider, Historical, [name]  buPROPion (WELLBUTRIN) 100 MG tablet Take 100 mg by mouth 2 (two) times daily.    Provider, Historical, [name]  doxepin (SINEQUAN) 10 MG capsule Take 10 mg by mouth at bedtime.    Provider, Historical, [name]  DULoxetine (CYMBALTA) 60 MG capsule Take 60 mg by mouth 2 (two) times daily.    Provider, Historical, [name]  famotidine (PEPCID) 20 MG tablet Take 20 mg by mouth daily.    Provider, Historical, [name]  fenofibrate (TRICOR) 145 MG tablet Take 145 mg by mouth daily.    Provider, Historical, [name]  HYDROcodone-acetaminophen (NORCO) 10-325 MG tablet Take 1 tablet by mouth daily as needed. 01/15/16   Provider, Historical, [name]  Insulin Glargine (LANTUS SOLOSTAR [place]) Inject 35 Units into the skin every evening.    Provider, Historical, [name]  metFORMIN (GLUCOPHAGE) 500 MG tablet Take 1,000 mg by mouth 2 (two) times daily with a meal.    Provider, Historical, [name]  methadone (DOLOPHINE) 10  MG tablet Take 30 mg by mouth 2 (two) times daily.    Provider, Historical, [name]  omeprazole (PRILOSEC) 40 MG capsule Take 40 mg by mouth 2 (two) times daily.    Provider, Historical, [name]  rosuvastatin (CRESTOR) 20 MG tablet Take 20 mg by mouth daily.    Provider, Historical, [name]  tiZANidine (ZANAFLEX) 4 MG tablet Take 4 mg by mouth every 8 (eight) hours as needed for muscle spasms.    Provider, Historical, [name]  valsartan-hydrochlorothiazide (DIOVAN-HCT) 320-25 MG tablet Take 1 tablet by mouth daily.    Provider, Historical,  [name]    Physical Exam: Vitals:   12/25/23 1504 12/25/23 1508  BP:  138/77  Pulse:  (!) 111  Resp:  18  Temp:  99.2 F (37.3 C)  TempSrc:  Oral  SpO2:  100%  Weight: 99.8 kg   Height: 5' 5 (1.651 m)     Physical Exam Constitutional:      General: He is not in acute distress.    Appearance: Normal appearance.  HENT:     Head: Normocephalic and atraumatic.     Mouth/Throat:     Mouth: Mucous membranes are moist.     Pharynx: Oropharynx is clear.  Eyes:     Extraocular Movements: Extraocular movements intact.     Pupils: Pupils are equal, round, and reactive to light.  Cardiovascular:     Rate and Rhythm: Normal rate and regular rhythm.     Pulses: Normal pulses.     Heart sounds: Normal heart sounds.  Pulmonary:     Effort: Pulmonary effort is normal. No respiratory distress.     Breath sounds: Normal breath sounds.  Abdominal:     General: Bowel sounds are normal. There is no distension.     Palpations: Abdomen is soft.     Tenderness: There is no abdominal tenderness.  Musculoskeletal:        General: Swelling present. No deformity.     Comments: Right third digit ulcer with surround erythema and edema   Skin:    General: Skin is warm and dry.  Neurological:     General: No focal deficit present.     Mental Status: Mental status is at baseline.     Labs on Admission: I have personally reviewed following labs and imaging studies  CBC: Recent Labs  Lab 12/25/23 1544  WBC 10.1  NEUTROABS 7.0  HGB 9.4*  HCT 30.0*  MCV 86.0  PLT 581*    Basic Metabolic Panel: Recent Labs  Lab 12/25/23 1544  NA 134*  K 5.3*  CL 97*  CO2 25  GLUCOSE 132*  BUN 23  CREATININE 1.74*  CALCIUM 9.4    GFR: Estimated Creatinine Clearance: 44.1 mL/min (A) (by C-G formula based on SCr of 1.74 mg/dL (H)).  Liver Function Tests: No results for input(s): AST, ALT, ALKPHOS, BILITOT, PROT, ALBUMIN in the last 168 hours.  Urine analysis:    Component Value  Date/Time   COLORURINE YELLOW 04/12/2007 1120   APPEARANCEUR CLEAR 04/12/2007 1120   LABSPEC 1.020 04/12/2007 1120   PHURINE 7.5 04/12/2007 1120   GLUCOSEU NEGATIVE 04/12/2007 1120   HGBUR NEGATIVE 04/12/2007 1120   BILIRUBINUR NEGATIVE 04/12/2007 1120   KETONESUR NEGATIVE 04/12/2007 1120   PROTEINUR >300 (A) 04/12/2007 1120   UROBILINOGEN 0.2 04/12/2007 1120   NITRITE NEGATIVE 04/12/2007 1120   LEUKOCYTESUR NEGATIVE 04/12/2007 1120    Radiological Exams on Admission: DG Finger Middle Right Result Date: 12/25/2023 CLINICAL DATA:  Suspected osteomyelitis  of the third right finger. EXAM: RIGHT MIDDLE FINGER 2+V COMPARISON:  None Available. FINDINGS: No evidence of an acute fracture or dislocation. There is cortical destruction of the tuft of the distal phalanx of the third right finger. Or small foci of adjacent soft tissue air are seen. Diffuse soft tissue swelling and a distal soft tissue defect are also noted. IMPRESSION: Findings consistent with acute osteomyelitis involving the tuft of the distal phalanx of the third right finger. Electronically Signed   By: Suzen Dials M.D.   On: 12/25/2023 16:15   EKG: Not performed in the emergency department  Assessment/Plan Active Problems:   DM (diabetes mellitus) (HCC)   HYPERCHOLESTEROLEMIA   GOUT   Essential hypertension   GERD   Generalized anxiety disorder   NAFLD (nonalcoholic fatty liver disease)   Morbid obesity with BMI of 40.0-44.9, adult (HCC)   Recurrent major depressive disorder, in full remission   Osteomyelitis Finger ulcer > Patient with worsening ulcer at his right third digit for several months.  Noted worsening purulence, drainage, pain, redness.  PCP referred him to hand surgery was sent to the ED for antibiotics and surgical intervention. > Started on vancomycin and Zosyn in the ED, history listed for penicillin allergy but so far has tolerated Zosyn.  > Discussed with hand surgery by EDP.  Hand surgeon plans  to have patient on IV antibiotics for a couple of days before surgical intervention. - Monitor on telemetry - Appreciate hand surgery recommendations and assistance - Continue with antibiotics: Vancomycin, no Zosyn with elevated creatinine, cefepime versus other with history of undetermined penicillin allergy. - Trend fever curve and WBC - As needed pain medication  PCN Allergy > Noted on chart.  Patient reports when he was a child he had a reaction and was told it was hives and swelling.  Has avoided penicillin antibiotics since that time so has not had a challenge. > Did receive partial dose possibly half dose of Zosyn in the ED prior to this being stopped.  No hives, swelling, other allergic symptoms. - Monitoring on telemetry as above - EDP, pharmacy, RN notified; will assist in monitoring - Antibiotic changed to cefepime, no reaction thus far on Zosyn.  If significant reaction develops would be reasonable to change to alternative (non-cephalosporin).  Hypertension - Continue home amlodipine, losartan-hydrochlorothiazide  Hyperlipidemia - Continue home atorvastatin  GERD - Continue PPI and Pepcid  Diabetes - SSI  ?CKD > Not listed in chart but recent baseline creatinine appears to be stable at 1.7.  Recent admission for AKI with creatinine to 3 which improved to 1.7. - Trend renal function and electrolytes  Anxiety Depression - Continue home duloxetine, bupropion, doxepin  Obesity - Noted  Lumbar spine disease - Continue home pain medication: Methadone and PRN Oxycodone  DVT prophylaxis: SCDs Code Status:   Full Family Communication:  None on admission  Disposition Plan:   Patient is from:  Home  Anticipated DC to:  Home  Anticipated DC date:  3 to 7 days  Anticipated DC barriers: None  Consults called:  Hand surgery Admission status:  Inpatient, telemetry  Severity of Illness: The appropriate patient status for this patient is INPATIENT. Inpatient status is  judged to be reasonable and necessary in order to provide the required intensity of service to ensure the patient's safety. The patient's presenting symptoms, physical exam findings, and initial radiographic and laboratory data in the context of their chronic comorbidities is felt to place them at high risk for  further clinical deterioration. Furthermore, it is not anticipated that the patient will be medically stable for discharge from the hospital within 2 midnights of admission.   * I certify that at the point of admission it is my clinical judgment that the patient will require inpatient hospital care spanning beyond 2 midnights from the point of admission due to high intensity of service, high risk for further deterioration and high frequency of surveillance required.Jeffrey [name] [facility]  How to contact the [facility] Attending or Consulting provider 7A - 7P or covering provider during after hours 7P -7A, for this patient?   Check the care team in [facility] and look for a) attending/consulting [facility] provider listed and b) the [facility] team listed Log into [email or web address] and use [facility] universal password to access. If you do not have the password, please contact the hospital operator. Locate the [facility] provider you are looking for under [facility] and page to a number that you can be directly reached. If you still have difficulty reaching the provider, please page the [facility] (Director on Call) for the Hospitalists listed on amion for assistance.  12/25/2023, 5:20 PM

## 2023-12-25 NOTE — ED Notes (Signed)
 Called Dr delene office and transfer call to  beam

## 2023-12-25 NOTE — Progress Notes (Signed)
 ED Pharmacy Antibiotic Sign Off An antibiotic consult was received from an ED provider for piperacillin/tazobactam per pharmacy dosing for finger wound. A chart review was completed to assess appropriateness.   The following one time order(s) were placed:  piperacillin/tazobactam 3.375g Vancomycin loading dose was adjusted to 1500mg  x1  Further antibiotic and/or antibiotic pharmacy consults should be ordered by the admitting provider if indicated.   Thank you for allowing pharmacy to be a part of this patient's care.   Jinnie JONETTA Door, [facility]  Clinical Pharmacist 12/25/23 4:41 PM

## 2023-12-25 NOTE — ED Triage Notes (Signed)
 Pt here for right middle finger injury. Pt states its infected. Pt states fingernail came off and pt started chewing on it. Pt is diabetic. Denies fevers.

## 2023-12-26 ENCOUNTER — Inpatient Hospital Stay (HOSPITAL_COMMUNITY)

## 2023-12-26 DIAGNOSIS — M86141 Other acute osteomyelitis, right hand: Secondary | ICD-10-CM | POA: Diagnosis not present

## 2023-12-26 LAB — CBC
HCT: 25.7 % — ABNORMAL LOW (ref 39.0–52.0)
Hemoglobin: 8.3 g/dL — ABNORMAL LOW (ref 13.0–17.0)
MCH: 27.8 pg (ref 26.0–34.0)
MCHC: 32.3 g/dL (ref 30.0–36.0)
MCV: 86 fL (ref 80.0–100.0)
Platelets: 488 K/uL — ABNORMAL HIGH (ref 150–400)
RBC: 2.99 MIL/uL — ABNORMAL LOW (ref 4.22–5.81)
RDW: 15.9 % — ABNORMAL HIGH (ref 11.5–15.5)
WBC: 7.9 K/uL (ref 4.0–10.5)
nRBC: 0 % (ref 0.0–0.2)

## 2023-12-26 LAB — URINALYSIS, ROUTINE W REFLEX MICROSCOPIC
Bacteria, UA: NONE SEEN
Bilirubin Urine: NEGATIVE
Glucose, UA: NEGATIVE mg/dL
Hgb urine dipstick: NEGATIVE
Ketones, ur: NEGATIVE mg/dL
Leukocytes,Ua: NEGATIVE
Nitrite: NEGATIVE
Protein, ur: 100 mg/dL — AB
Specific Gravity, Urine: 1.009 (ref 1.005–1.030)
pH: 7 (ref 5.0–8.0)

## 2023-12-26 LAB — COMPREHENSIVE METABOLIC PANEL WITH GFR
ALT: 32 U/L (ref 0–44)
AST: 22 U/L (ref 15–41)
Albumin: 2.7 g/dL — ABNORMAL LOW (ref 3.5–5.0)
Alkaline Phosphatase: 36 U/L — ABNORMAL LOW (ref 38–126)
Anion gap: 11 (ref 5–15)
BUN: 22 mg/dL (ref 8–23)
CO2: 24 mmol/L (ref 22–32)
Calcium: 8.9 mg/dL (ref 8.9–10.3)
Chloride: 101 mmol/L (ref 98–111)
Creatinine, Ser: 1.63 mg/dL — ABNORMAL HIGH (ref 0.61–1.24)
GFR, Estimated: 46 mL/min — ABNORMAL LOW (ref >60)
Glucose, Bld: 90 mg/dL (ref 70–99)
Potassium: 4.3 mmol/L (ref 3.5–5.1)
Sodium: 136 mmol/L (ref 135–145)
Total Bilirubin: 0.6 mg/dL (ref 0.0–1.2)
Total Protein: 6.3 g/dL — ABNORMAL LOW (ref 6.5–8.1)

## 2023-12-26 LAB — CREATININE, URINE, RANDOM: Creatinine, Urine: 39 mg/dL

## 2023-12-26 LAB — PROTIME-INR
INR: 1 (ref 0.8–1.2)
Prothrombin Time: 13.9 s (ref 11.4–15.2)

## 2023-12-26 LAB — TYPE AND SCREEN
ABO/RH(D): O POS
Antibody Screen: NEGATIVE

## 2023-12-26 LAB — MRSA NEXT GEN BY PCR, NASAL: MRSA by PCR Next Gen: NOT DETECTED

## 2023-12-26 LAB — GLUCOSE, CAPILLARY
Glucose-Capillary: 95 mg/dL (ref 70–99)
Glucose-Capillary: 123 mg/dL — ABNORMAL HIGH (ref 70–99)
Glucose-Capillary: 130 mg/dL — ABNORMAL HIGH (ref 70–99)
Glucose-Capillary: 86 mg/dL (ref 70–99)

## 2023-12-26 LAB — PROTEIN / CREATININE RATIO, URINE
Creatinine, Urine: 39 mg/dL
Protein Creatinine Ratio: 2.15 mg/mg{creat} — ABNORMAL HIGH (ref 0.00–0.15)
Total Protein, Urine: 84 mg/dL

## 2023-12-26 LAB — HIV ANTIBODY (ROUTINE TESTING W REFLEX): HIV Screen 4th Generation wRfx: NONREACTIVE

## 2023-12-26 LAB — SODIUM, URINE, RANDOM: Sodium, Ur: 87 mmol/L

## 2023-12-26 MED ORDER — INFLUENZA VAC SPLIT HIGH-DOSE 0.5 ML IM SUSY
0.5000 mL | PREFILLED_SYRINGE | INTRAMUSCULAR | Status: AC
  Administered 2023-12-27: 0.5 mL via INTRAMUSCULAR
  Filled 2023-12-26 (×2): qty 0.5

## 2023-12-26 MED ORDER — VANCOMYCIN HCL 750 MG/150ML IV SOLN
750.0000 mg | INTRAVENOUS | Status: DC
  Administered 2023-12-26: 750 mg via INTRAVENOUS
  Filled 2023-12-26: qty 150

## 2023-12-26 MED ORDER — HEPARIN SODIUM (PORCINE) 5000 UNIT/ML IJ SOLN
5000.0000 [IU] | Freq: Three times a day (TID) | INTRAMUSCULAR | Status: DC
  Administered 2023-12-26 – 2023-12-28 (×8): 5000 [IU] via SUBCUTANEOUS
  Filled 2023-12-26 (×8): qty 1

## 2023-12-26 NOTE — TOC Initial Note (Signed)
 Transition of Care [facility]) - Initial/Assessment Note    Patient Details  Name: Jeffrey [name] MRN: [id number] Date of Birth: 02-12-1956  Transition of Care [facility]) CM/SW Contact:    Marval Gell, RN Phone Number: 12/26/2023, 1:52 PM  Clinical Narrative:                  Spoke w patient at bedside to screen for DC needs. He states that he is from home with wife. He still works part time, drives and describes himself as independent. Patient states he has PCP and no barriers to obtaining meds.  Plan is for IV Abx for infection to tip of R middle finger No needs identified at this time, will continue to follow    Expected Discharge Plan: Home/Self Care Barriers to Discharge: Continued Medical Work up   Patient Goals and CMS Choice Patient states their goals for this hospitalization and ongoing recovery are:: to go home          Expected Discharge Plan and Services   Discharge Planning Services: CM Consult   Living arrangements for the past 2 months: Single Family Home                                      Prior Living Arrangements/Services Living arrangements for the past 2 months: Single Family Home Lives with:: Spouse                   Activities of Daily Living   ADL Screening (condition at time of admission) Independently performs ADLs?: Yes (appropriate for developmental age) Is the patient deaf or have difficulty hearing?: No Does the patient have difficulty seeing, even when wearing glasses/contacts?: No Does the patient have difficulty concentrating, remembering, or making decisions?: No  Permission Sought/Granted                  Emotional Assessment              Admission diagnosis:  Osteomyelitis (HCC) [M86.9] Finger osteomyelitis (HCC) [M86.9] AKI (acute kidney injury) [N17.9] Patient Active Problem List   Diagnosis Date Noted   Osteomyelitis (HCC) 12/25/2023   Recurrent major depressive disorder, in full remission 08/23/2018    NAFLD (nonalcoholic fatty liver disease) 96/94/7982   Morbid obesity with BMI of 40.0-44.9, adult (HCC) 05/31/2015   Lumbar spinal stenosis 05/31/2015   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 [place] [address] Phone: [phone] Fax: [phone]     Social Drivers of Health (SDOH) Social History: SDOH Screenings   Food Insecurity: No Food Insecurity (12/26/2023)  Housing: Low Risk  (12/26/2023)  Transportation Needs: No Transportation Needs (12/26/2023)  Utilities: Not At Risk (12/26/2023)  Social Connections: Unknown (12/26/2023)  Tobacco Use: High Risk (12/25/2023)   SDOH Interventions:     Readmission Risk Interventions     No data to display

## 2023-12-26 NOTE — Progress Notes (Incomplete)
 Pharmacy Note- Penicillin Allergy Clarification   ASSESSMENT:  - 9/30 patient not in room >> attempt to assess PCN allergy during admission  PEN-FAST Scoring   Five years or less since last reaction {yesplus2:28902}  Anaphylaxis/Angioedema OR Severe cutaneous adverse reaction  {yesplus2:28902}  Treatment required for reaction  {yesplus1:28901}  Total Score {Penfasttotal:28903}    Type of intervention (select all that apply):  {Intervention:24296}  Impact on therapy (select all that apply):  {Impact:24297}   PLAN: - amoxicillin 500 mg PO x1  - Q15min vitals monitoring x 1 hour  - Epi-Pen PRN  - IV diphenhydramine PRN  - plan communicated to primary RN   POST-CHALLENGE FOLLOW-UP:

## 2023-12-26 NOTE — Progress Notes (Addendum)
 Orthopaedic Surgery Hand and Upper Extremity History and Physical Examination 12/26/2023   CC: Right long finger osteomyelitis  HPI: Jeffrey [name] is a 68 y.o. male with osteomyelitis of the right long finger distal phalanx with small overlying skin necrosis seen today after admission to the hospital from my clinic yesterday for IV antibiotics.     Past Medical History: Past Medical History:  Diagnosis Date   DM (diabetes mellitus) (HCC)    Hyperlipidemia    Hypertension    PAC (premature atrial contraction) 10/08/2015   Proteinuria      Medications: Scheduled Meds:  amLODipine  10 mg Oral Daily   buPROPion  100 mg Oral BID   doxepin  10 mg Oral QHS   DULoxetine  60 mg Oral BID   famotidine  20 mg Oral Daily   fenofibrate  160 mg Oral Daily   heparin injection (subcutaneous)  5,000 Units Subcutaneous Q8H   hydrochlorothiazide  25 mg Oral Daily   [START ON 12/27/2023] Influenza vac split trivalent PF  0.5 mL Intramuscular Tomorrow-1000   insulin aspart  0-9 Units Subcutaneous TID WC   irbesartan  300 mg Oral Daily   methadone  10 mg Oral Q8H   pantoprazole  40 mg Oral Daily   sodium chloride flush  3 mL Intravenous Q12H   Continuous Infusions:  ceFEPime (MAXIPIME) IV 2 g (12/26/23 1113)   vancomycin     PRN Meds:.acetaminophen **OR** acetaminophen, oxyCODONE-acetaminophen, polyethylene glycol  Allergies: Allergies as of 12/25/2023 - Review Complete 12/25/2023  Allergen Reaction Noted   Penicillins Hives     Past Surgical History: Past Surgical History:  Procedure Laterality Date   APPENDECTOMY     BACK SURGERY     REPLACEMENT TOTAL KNEE       Social History: Social History   Occupational History   Not on file  Tobacco Use   Smoking status: Every Day   Smokeless tobacco: Not on file  Substance and Sexual Activity   Alcohol use: No   Drug use: Not on file   Sexual activity: Not on file     Family History: Family History  Problem Relation Age of  Onset   Aortic stenosis Mother    CAD Brother    Otherwise, no relevant orthopaedic family history  ROS: Review of Systems: All systems reviewed and are negative except that mentioned in HPI  Work/Sport/Hobbies: See HPI  Physical Examination: Vitals:   12/26/23 0755 12/26/23 1142  BP: (!) 122/57 124/69  Pulse: 76   Resp:    Temp: 98.1 F (36.7 C) (!) 97.5 F (36.4 C)  SpO2:     Constitutional: Awake, alert.  WN/WD Appearance: healthy, no acute distress, well-groomed Affect: Normal HEENT: EOMI, mucous membranes moist CV: RRR Pulm: breathing comfortably   Right Upper Extremity / Hand Long finger with some erythema distally.  The distalmost tip has a small area of skin necrosis.  The long finger is non-tender to palpation, sensation is intact distally.     Pertinent Labs: n/a  Imaging: I have personally reviewed the following studies: N/a  Additional Studies: n/a  Assessment/Plan: Jeffrey [name] is a 68 y.o. male with Right long finger distal phalanx osteomyelitis   Discussed with patient treatment options at this time, including both operative and non-operative.  He would like to avoid surgery as much as possible.  Given that the small area of necrosis is limited to the distal tip, it is possible the skin could heal underneath if the  infection can be eradicated.  I explained to him that there is a possibility he will still need surgery if the infection cannot be cleared but he is adamant that he does not want surgery.  -continue IV abx per primary/ID -recommend keeping finger bandaged for comfort    Marsa Christen, MD Hand and Upper Extremity Surgery [facility] [phone] 12/26/2023 2:23 PM

## 2023-12-26 NOTE — Progress Notes (Signed)
 PROGRESS NOTE                                                                                                                                                                                                             Patient Demographics:    Jeffrey [name], is a 68 y.o. male, DOB - 1955-04-21, FMW:[id number]  Outpatient Primary MD for the patient is [name] HERO., MD    LOS - 1  Admit date - 12/25/2023    No chief complaint on file.      Brief Narrative (HPI from H&P)   68 y.o. male with medical history significant of hypertension, hyperlipidemia, GERD, diabetes, gout, anxiety, depression, obesity, nonalcoholic fatty liver disease, lumbar spine disease presenting with infected right third digit which has been like that for about 3 to 4 weeks, no fever or chills presented to see the hand surgeon Dr. Delene on 12/25/2023 and was sent to the hospital for antibiotic treatment.   Subjective:    Jeffrey [name] today has, No headache, No chest pain, No abdominal pain - No Nausea, No new weakness tingling or numbness, some discomfort in the right middle finger but overall better than before.   Assessment  & Plan :   Osteomyelitis of right middle finger with ulcer at the tip send for the last 3 to 4 weeks > Was seen at the hand surgery office on 12/25/2023 and sent to the hospital for IV antibiotics to see if it would help, he is currently on empiric IV antibiotics, no leukocytosis or fever, will continue antibiotics obtain ID input.  Discussed the case with hand surgeon Dr. Delene on 12/26/2023.  He will follow the patient while patient is in the hospital he had seen him yesterday in his office.  Follow cultures.  Hypertension - Continue home amlodipine, losartan-hydrochlorothiazide   Hyperlipidemia - Continue home atorvastatin   GERD - Continue PPI and Pepcid   CKD 3B. Outpatient creatinine from 10 days ago appears  to be close to 3, creatinine has improved somewhat, monitor, this could be his new baseline.  Could have underlying diabetic nephropathy.  Will monitor.  Will check a UA and renal ultrasound as well.   Anxiety Depression - Continue home duloxetine, bupropion, doxepin   Obesity - Noted   Lumbar spine disease - Continue home pain medication: Methadone and PRN  Oxycodone  Diabetes - SSI  Lab Results  Component Value Date   HGBA1C 6.1 (H) 09/03/2007   CBG (last 3)  Recent Labs    12/25/23 2144 12/26/23 0800  GLUCAP 82 95         Condition - Extremely Guarded  Family Communication  : None present  Code Status : Full code  Consults  : Hydrographic surveyor Dr.Chiaramonti consulted on 12/25/2023, ID  PUD Prophylaxis : PPI   Procedures  :            Disposition Plan  :    Status is: Inpatient  DVT Prophylaxis  :  Heparin  SCDs Start: 12/25/23 1720    Lab Results  Component Value Date   PLT 488 (H) 12/26/2023    Diet :  Diet Order             Diet regular Room service appropriate? Yes; Fluid consistency: Thin  Diet effective now                    Inpatient Medications  Scheduled Meds:  amLODipine  10 mg Oral Daily   buPROPion  100 mg Oral BID   doxepin  10 mg Oral QHS   DULoxetine  60 mg Oral BID   famotidine  20 mg Oral Daily   fenofibrate  160 mg Oral Daily   hydrochlorothiazide  25 mg Oral Daily   insulin aspart  0-9 Units Subcutaneous TID WC   irbesartan  300 mg Oral Daily   methadone  10 mg Oral Q8H   pantoprazole  40 mg Oral Daily   sodium chloride flush  3 mL Intravenous Q12H   Continuous Infusions:  ceFEPime (MAXIPIME) IV 2 g (12/25/23 2336)   [START ON 12/27/2023] vancomycin     PRN Meds:.acetaminophen **OR** acetaminophen, oxyCODONE-acetaminophen, polyethylene glycol  Antibiotics  :    Anti-infectives (From admission, onward)    Start     Dose/Rate Route Frequency Ordered Stop   12/27/23 0615  vancomycin (VANCOREADY) IVPB 1250  mg/250 mL       Placed in Followed by Linked Group   1,250 mg 166.7 mL/hr over 90 Minutes Intravenous Every 36 hours 12/25/23 1813     12/25/23 2300  ceFEPIme (MAXIPIME) 2 g in sodium chloride 0.9 % 100 mL IVPB        2 g 200 mL/hr over 30 Minutes Intravenous Every 12 hours 12/25/23 1813     12/25/23 1815  vancomycin (VANCOREADY) IVPB 1500 mg/300 mL       Placed in Followed by Linked Group   1,500 mg 150 mL/hr over 120 Minutes Intravenous  Once 12/25/23 1813 12/25/23 2058   12/25/23 1645  vancomycin (VANCOREADY) IVPB 1500 mg/300 mL  Status:  Discontinued        1,500 mg 150 mL/hr over 120 Minutes Intravenous  Once 12/25/23 1638 12/25/23 1813   12/25/23 1645  piperacillin-tazobactam (ZOSYN) IVPB 3.375 g  Status:  Discontinued        3.375 g 12.5 mL/hr over 240 Minutes Intravenous Once 12/25/23 1642 12/25/23 1729   12/25/23 1630  vancomycin (VANCOCIN) IVPB 1000 mg/200 mL premix  Status:  Discontinued        1,000 mg 200 mL/hr over 60 Minutes Intravenous  Once 12/25/23 1629 12/25/23 1638         Objective:   Vitals:   12/25/23 1508 12/25/23 2032 12/25/23 2102 12/26/23 0755  BP: 138/77  116/71 (!) 122/57  Pulse: (!) 111  62 76  Resp: 18  17   Temp: 99.2 F (37.3 C) (!) 97.2 F (36.2 C)  98.1 F (36.7 C)  TempSrc: Oral Oral  Oral  SpO2: 100%  97%   Weight:      Height:        Wt Readings from Last 3 Encounters:  12/25/23 99.8 kg  02/25/16 115.2 kg  02/05/16 115.2 kg     Intake/Output Summary (Last 24 hours) at 12/26/2023 0813 Last data filed at 12/26/2023 0800 Gross per 24 hour  Intake --  Output 1325 ml  Net -1325 ml     Physical Exam  Awake Alert, No new F.N deficits, Normal affect [place].AT,PERRAL Supple Neck, No JVD,   Symmetrical Chest wall movement, Good air movement bilaterally, CTAB RRR,No Gallops,Rubs or new Murmurs,  +ve B.Sounds, Abd Soft, No tenderness,   Right middle finger as below swollen, red with dead necrotic tip       Data Review:     Recent Labs  Lab 12/25/23 1544 12/26/23 0511  WBC 10.1 7.9  HGB 9.4* 8.3*  HCT 30.0* 25.7*  PLT 581* 488*  MCV 86.0 86.0  MCH 26.9 27.8  MCHC 31.3 32.3  RDW 15.9* 15.9*  LYMPHSABS 2.1  --   MONOABS 0.6  --   EOSABS 0.1  --   BASOSABS 0.1  --     Recent Labs  Lab 12/25/23 1544 12/26/23 0511 12/26/23 0616  NA 134* 136  --   K 5.3* 4.3  --   CL 97* 101  --   CO2 25 24  --   ANIONGAP 12 11  --   GLUCOSE 132* 90  --   BUN 23 22  --   CREATININE 1.74* 1.63*  --   AST  --  22  --   ALT  --  32  --   ALKPHOS  --  36*  --   BILITOT  --  0.6  --   ALBUMIN  --  2.7*  --   INR  --   --  1.0  CALCIUM 9.4 8.9  --       Recent Labs  Lab 12/25/23 1544 12/26/23 0511 12/26/23 0616  INR  --   --  1.0  CALCIUM 9.4 8.9  --     --------------------------------------------------------------------------------------------------------------- Lab Results  Component Value Date   CHOL 330 (HH) 08/02/2007   HDL 73.5 08/02/2007   LDLDIRECT 231.8 08/02/2007   TRIG 312 (HH) 08/02/2007   CHOLHDL 4.5 CALC 08/02/2007    Lab Results  Component Value Date   HGBA1C 6.1 (H) 09/03/2007   No results for input(s): TSH, T4TOTAL, FREET4, T3FREE, THYROIDAB in the last 72 hours. No results for input(s): VITAMINB12, FOLATE, FERRITIN, TIBC, IRON, RETICCTPCT in the last 72 hours. ------------------------------------------------------------------------------------------------------------------ Cardiac Enzymes No results for input(s): CKMB, TROPONINI, MYOGLOBIN in the last 168 hours.  Invalid input(s): CK  Micro Results No results found for this or any previous visit (from the past 240 hours).  Radiology Report DG Finger Middle Right Result Date: 12/25/2023 CLINICAL DATA:  Suspected osteomyelitis of the third right finger. EXAM: RIGHT MIDDLE FINGER 2+V COMPARISON:  None Available. FINDINGS: No evidence of an acute fracture or dislocation. There is cortical  destruction of the tuft of the distal phalanx of the third right finger. Or small foci of adjacent soft tissue air are seen. Diffuse soft tissue swelling and a distal soft tissue defect are also noted. IMPRESSION: Findings consistent with acute osteomyelitis involving  the tuft of the distal phalanx of the third right finger. Electronically Signed   By: Suzen Dials M.D.   On: 12/25/2023 16:15     Signature  -   Lavada Stank M.D on 12/26/2023 at 8:13 AM   -  To page go to [email or web address]

## 2023-12-26 NOTE — Plan of Care (Signed)

## 2023-12-26 NOTE — Consult Note (Signed)
 [facility]       Date of Admission:  12/25/2023     Total days of antibiotics: 1  Vanc / Zosyn                Reason for Consult: Acute Osteomyelitis of Finger     Referring Provider: Dennise Primary Care Provider: [name] HERO., MD   Assessment: Jeffrey [name] is a 68 y.o. male admitted from outpatient clinic for    Right #3 Finger Acute Osteomyelitis -  Chronic wound of 1 month with acute worsening including swelling, pain, erythema, necrosis and purulent drainage involving the right middle finger. Dr. Delene with hand surgery is following for indications he may need surgery for management. He has had good response with broad spectrum IV antibiotics now 24 hours later. No signs of acute systemic infection / SIRS. Awaiting MRSA PCR to come back. Given history of biting finger potential for oral flora contributing here higher - would continue vancomycin and zosyn for now.  Will discuss more regarding recommendations outpatient - may use a combination of long-acting IV dalvance + prolonged course of oral Augmentin to cover consideration for pathogens like Eikenella.  Would suspect he needs 2-3 days of IV antibiotics to get a sense of improvement trajectory.  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 We discussed this today and it appears he has outgrown the allergy. Will remove from the chart records.   Plan: Continue IV vancomycin + zosyn  Follow up clinical response  ESR/CRP in AM  Remove PCN from allergy list     Principal Problem:   Osteomyelitis (HCC) Active Problems:   DM (diabetes mellitus) (HCC)   HYPERCHOLESTEROLEMIA   GOUT   Essential hypertension   GERD   Generalized anxiety disorder   NAFLD (nonalcoholic fatty liver disease)   Morbid obesity with BMI of 40.0-44.9, adult (HCC)   Recurrent major depressive  disorder, in full remission    amLODipine  10 mg Oral Daily   buPROPion  100 mg Oral BID   doxepin  10 mg Oral QHS   DULoxetine  60 mg Oral BID   famotidine  20 mg Oral Daily   fenofibrate  160 mg Oral Daily   heparin injection (subcutaneous)  5,000 Units Subcutaneous Q8H   hydrochlorothiazide  25 mg Oral Daily   insulin aspart  0-9 Units Subcutaneous TID WC   irbesartan  300 mg Oral Daily   methadone  10 mg Oral Q8H   pantoprazole  40 mg Oral Daily   sodium chloride flush  3 mL Intravenous Q12H    HPI: Jeffrey [name] is a 68 y.o. male sent to the hospital for admission after being seen by hand surgery team on 9/29 for this problem.   He saw Dr. Delene with reports of malodorous discharge from the finger with blackened tip. He first noted this there about a month ago and not sure the initial cause of the nail injury, but he did try to bite the fingernail off biting it off.  He feels like the antibiotics are helping and the pain is decreased and drainage is gone.  His hand surgeon wants to keep him her for a period of time.   Review of Systems: Review of Systems  Constitutional:  Negative for chills, fever  and malaise/fatigue.  Respiratory:  Negative for cough.   Cardiovascular:  Negative for chest pain.  Gastrointestinal:  Negative for abdominal pain, nausea and vomiting.  Genitourinary:  Negative for dysuria.  Musculoskeletal:  Positive for joint pain.  Neurological:  Negative for dizziness.     Past Medical History:  Diagnosis Date   DM (diabetes mellitus) (HCC)    Hyperlipidemia    Hypertension    PAC (premature atrial contraction) 10/08/2015   Proteinuria     Social History   Tobacco Use   Smoking status: Every Day  Substance Use Topics   Alcohol use: No    Family History  Problem Relation Age of Onset   Aortic stenosis Mother    CAD Brother    Allergies  Allergen Reactions   Penicillins Hives    Childhood allergy. Has not had any since.  Tolerated pip/tazo 12/25/23    OBJECTIVE: Blood pressure 124/69, pulse 76, temperature (!) 97.5 F (36.4 C), temperature source Oral, resp. rate 17, height 5' 5 (1.651 m), weight 99.8 kg, SpO2 97%.  Physical Exam Constitutional:      Appearance: Normal appearance.  Cardiovascular:     Rate and Rhythm: Normal rate.  Pulmonary:     Effort: Pulmonary effort is normal.     Breath sounds: Normal breath sounds.  Abdominal:     General: There is no distension.  Musculoskeletal:     Comments: Right #3 finger photographed   Skin:    General: Skin is dry.  Neurological:     Mental Status: He is alert.          Lab Results Lab Results  Component Value Date   WBC 7.9 12/26/2023   HGB 8.3 (L) 12/26/2023   HCT 25.7 (L) 12/26/2023   MCV 86.0 12/26/2023   PLT 488 (H) 12/26/2023    Lab Results  Component Value Date   CREATININE 1.63 (H) 12/26/2023   BUN 22 12/26/2023   NA 136 12/26/2023   K 4.3 12/26/2023   CL 101 12/26/2023   CO2 24 12/26/2023    Lab Results  Component Value Date   ALT 32 12/26/2023   AST 22 12/26/2023   ALKPHOS 36 (L) 12/26/2023   BILITOT 0.6 12/26/2023     Microbiology: No results found for this or any previous visit (from the past 240 hours).  Corean Fireman, MSN, NP-C [facility] Pager: [phone]  12/26/2023 12:06 PM    Total Encounter Time: 18 m

## 2023-12-26 NOTE — Progress Notes (Signed)
 Pt. Requested Cardiac Monitor off and Doctor approved to discontinue

## 2023-12-27 ENCOUNTER — Other Ambulatory Visit (HOSPITAL_COMMUNITY): Payer: Self-pay

## 2023-12-27 ENCOUNTER — Other Ambulatory Visit: Payer: Self-pay

## 2023-12-27 DIAGNOSIS — M869 Osteomyelitis, unspecified: Secondary | ICD-10-CM | POA: Diagnosis not present

## 2023-12-27 LAB — CBC WITH DIFFERENTIAL/PLATELET
Abs Immature Granulocytes: 0.21 K/uL — ABNORMAL HIGH (ref 0.00–0.07)
Basophils Absolute: 0.1 K/uL (ref 0.0–0.1)
Basophils Relative: 1 %
Eosinophils Absolute: 0.2 K/uL (ref 0.0–0.5)
Eosinophils Relative: 3 %
HCT: 27.8 % — ABNORMAL LOW (ref 39.0–52.0)
Hemoglobin: 8.7 g/dL — ABNORMAL LOW (ref 13.0–17.0)
Immature Granulocytes: 2 %
Lymphocytes Relative: 37 %
Lymphs Abs: 3.2 K/uL (ref 0.7–4.0)
MCH: 27.1 pg (ref 26.0–34.0)
MCHC: 31.3 g/dL (ref 30.0–36.0)
MCV: 86.6 fL (ref 80.0–100.0)
Monocytes Absolute: 0.7 K/uL (ref 0.1–1.0)
Monocytes Relative: 8 %
Neutro Abs: 4.2 K/uL (ref 1.7–7.7)
Neutrophils Relative %: 49 %
Platelets: 535 K/uL — ABNORMAL HIGH (ref 150–400)
RBC: 3.21 MIL/uL — ABNORMAL LOW (ref 4.22–5.81)
RDW: 15.9 % — ABNORMAL HIGH (ref 11.5–15.5)
WBC: 8.6 K/uL (ref 4.0–10.5)
nRBC: 0 % (ref 0.0–0.2)

## 2023-12-27 LAB — SEDIMENTATION RATE: Sed Rate: 60 mm/h — ABNORMAL HIGH (ref 0–16)

## 2023-12-27 LAB — BASIC METABOLIC PANEL WITH GFR
Anion gap: 11 (ref 5–15)
BUN: 18 mg/dL (ref 8–23)
CO2: 24 mmol/L (ref 22–32)
Calcium: 8.8 mg/dL — ABNORMAL LOW (ref 8.9–10.3)
Chloride: 98 mmol/L (ref 98–111)
Creatinine, Ser: 1.54 mg/dL — ABNORMAL HIGH (ref 0.61–1.24)
GFR, Estimated: 49 mL/min — ABNORMAL LOW (ref >60)
Glucose, Bld: 85 mg/dL (ref 70–99)
Potassium: 4.4 mmol/L (ref 3.5–5.1)
Sodium: 133 mmol/L — ABNORMAL LOW (ref 135–145)

## 2023-12-27 LAB — GLUCOSE, CAPILLARY
Glucose-Capillary: 134 mg/dL — ABNORMAL HIGH (ref 70–99)
Glucose-Capillary: 163 mg/dL — ABNORMAL HIGH (ref 70–99)
Glucose-Capillary: 149 mg/dL — ABNORMAL HIGH (ref 70–99)
Glucose-Capillary: 233 mg/dL — ABNORMAL HIGH (ref 70–99)

## 2023-12-27 LAB — CK: Total CK: 96 U/L (ref 49–397)

## 2023-12-27 LAB — C-REACTIVE PROTEIN: CRP: 2.7 mg/dL — ABNORMAL HIGH (ref <1.0)

## 2023-12-27 MED ORDER — METRONIDAZOLE 500 MG PO TABS
500.0000 mg | ORAL_TABLET | Freq: Two times a day (BID) | ORAL | 0 refills | Status: AC
  Filled 2023-12-27: qty 80, 40d supply, fill #0

## 2023-12-27 MED ORDER — SODIUM CHLORIDE 0.9 % IV SOLN
8.0000 mg/kg | Freq: Every day | INTRAVENOUS | Status: DC
  Administered 2023-12-27 – 2023-12-28 (×2): 600 mg via INTRAVENOUS
  Filled 2023-12-27 (×2): qty 12

## 2023-12-27 MED ORDER — CEFTRIAXONE IV (FOR PTA / DISCHARGE USE ONLY): 2.0000 g | INTRAVENOUS | 0 refills | Status: AC

## 2023-12-27 MED ORDER — CEFTRIAXONE SODIUM 2 G IJ SOLR
2.0000 g | INTRAMUSCULAR | Status: DC
  Administered 2023-12-27 – 2023-12-28 (×2): 2 g via INTRAVENOUS
  Filled 2023-12-27 (×2): qty 20

## 2023-12-27 MED ORDER — MELATONIN 3 MG PO TABS
3.0000 mg | ORAL_TABLET | Freq: Once | ORAL | Status: AC
  Administered 2023-12-27: 3 mg via ORAL
  Filled 2023-12-27: qty 1

## 2023-12-27 MED ORDER — METRONIDAZOLE 500 MG PO TABS
500.0000 mg | ORAL_TABLET | Freq: Two times a day (BID) | ORAL | Status: DC
Start: 2023-12-27 — End: 2024-02-05
  Administered 2023-12-27 – 2023-12-28 (×3): 500 mg via ORAL
  Filled 2023-12-27 (×3): qty 1

## 2023-12-27 MED ORDER — DAPTOMYCIN IV (FOR PTA / DISCHARGE USE ONLY): 600.0000 mg | INTRAVENOUS | 0 refills | Status: AC

## 2023-12-27 NOTE — Progress Notes (Signed)
 [facility]  Date of Admission:  12/25/2023      Total days of antibiotics 2    ASSESSMENT: Jeffrey [name] is a 68 y.o. male admitted with:   Acute Osteomyelitis of the right #3 finger -  Having some improvement on broad spectrum coverage with Cefepime + vanc + metronidazole. No discharge to culture. We discussed options for PO vs IV treatment and decided to go the IV route. Will discharge him on Daptomycin once daily and Ceftriaxone once daily along with PO Metronidazole twice daily to cover broadly including odontogenic organisms. MRSA PCR negative.  Will see how he is doing at 3 weeks to see if he would be at a place to transition to oral equivalent of what we have him on IV. He will need a total of 6 weeks of antibiotic treatment with close follow up with surgery team.   Vascular Access -  -PICC ordered -Home health orders to maintain PICC line care and education for patient described below   Discharge Planning / Coordination of Care -  -Outpatient antibiotics set -Discussed with Holley Herring, ID pharmacy and primary   Medication Monitoring -  -Safety labs ordered and detailed below to be followed in OPAT clinic   PLAN: IV antibiotics as outlined below  PICC ordered  D/W CM and Dr. Sherlon   OPAT ORDERS:  Diagnosis: Acute osteomyelitis of the finger   Culture Result: none  No Active Allergies   Discharge antibiotics to be given via PICC line:  Daptomycin IV + Ceftriaxone 2 gm IV   +  Oral Metronidazole 500 mg BID   Duration:  6 weeks   End Date: November 10th   [facility] Care Per Protocol with [employer] Use: Home health RN for IV administration and teaching, line care and labs.    Labs weekly while on IV antibiotics: _x_ CBC with differential __ BMP **TWICE WEEKLY ON VANCOMYCIN  __ CMP _x_ CRP _x_ ESR __ Vancomycin trough TWICE WEEKLY _x_ CK  _x_ Please pull PIC at completion of IV antibiotics __ Please leave  PIC in place until doctor has seen patient or been notified  Fax weekly labs to [phone] 10/22 @ 10: 30 am with Dr. Dennise    Principal Problem:   Finger osteomyelitis (HCC) Active Problems:   DM (diabetes mellitus) (HCC)   HYPERCHOLESTEROLEMIA   GOUT   Essential hypertension   GERD   Generalized anxiety disorder   NAFLD (nonalcoholic fatty liver disease)   Morbid obesity with BMI of 40.0-44.9, adult (HCC)   Recurrent major depressive disorder, in full remission    amLODipine  10 mg Oral Daily   buPROPion  100 mg Oral BID   doxepin  10 mg Oral QHS   DULoxetine  60 mg Oral BID   famotidine  20 mg Oral Daily   fenofibrate  160 mg Oral Daily   heparin injection (subcutaneous)  5,000 Units Subcutaneous Q8H   hydrochlorothiazide  25 mg Oral Daily   Influenza vac split trivalent PF  0.5 mL Intramuscular Tomorrow-1000   insulin aspart  0-9 Units Subcutaneous TID WC   irbesartan  300 mg Oral Daily   methadone  10 mg Oral Q8H   metroNIDAZOLE  500 mg Oral Q12H   pantoprazole  40 mg Oral Daily   sodium chloride flush  3 mL Intravenous Q12H    SUBJECTIVE: No further drainage or pain from the finger. Still swollen but ROM  is better. Some erythema noted   Review of Systems: ROS  No Active Allergies  OBJECTIVE: Vitals:   12/26/23 2000 12/27/23 0000 12/27/23 0744 12/27/23 1055  BP: 129/75 126/67 (!) 129/56 (!) 141/80  Pulse:  67 71   Resp:      Temp: 98.3 F (36.8 C) 98.2 F (36.8 C) (!) 97.2 F (36.2 C)   TempSrc:  Oral Oral   SpO2:      Weight:      Height:       Body mass index is 36.61 kg/m.  Physical Exam Constitutional:      Appearance: Normal appearance. He is not ill-appearing.  HENT:     Head: Normocephalic.     Mouth/Throat:     Mouth: Mucous membranes are moist.     Pharynx: Oropharynx is clear.  Eyes:     General: No scleral icterus. Cardiovascular:     Rate and Rhythm: Normal rate.  Pulmonary:     Effort: Pulmonary effort is normal.   Musculoskeletal:        General: Normal range of motion.     Cervical back: Normal range of motion.     Comments: Right long finger with necrosed dry tip. Erythema and swelling improving   Skin:    Coloration: Skin is not jaundiced or pale.  Neurological:     Mental Status: He is alert and oriented to person, place, and time.  Psychiatric:        Mood and Affect: Mood normal.        Judgment: Judgment normal.     Lab Results Lab Results  Component Value Date   WBC 8.6 12/27/2023   HGB 8.7 (L) 12/27/2023   HCT 27.8 (L) 12/27/2023   MCV 86.6 12/27/2023   PLT 535 (H) 12/27/2023    Lab Results  Component Value Date   CREATININE 1.54 (H) 12/27/2023   BUN 18 12/27/2023   NA 133 (L) 12/27/2023   K 4.4 12/27/2023   CL 98 12/27/2023   CO2 24 12/27/2023    Lab Results  Component Value Date   ALT 32 12/26/2023   AST 22 12/26/2023   ALKPHOS 36 (L) 12/26/2023   BILITOT 0.6 12/26/2023     Microbiology: Recent Results (from the past 240 hours)  MRSA Next Gen by PCR, Nasal     Status: None   Collection Time: 12/26/23  8:17 AM   Specimen: Nasal Mucosa; Nasal Swab  Result Value Ref Range Status   MRSA by PCR Next Gen NOT DETECTED NOT DETECTED Final    Comment: (NOTE) The GeneXpert MRSA Assay (FDA approved for NASAL specimens only), is one component of a comprehensive MRSA colonization surveillance program. It is not intended to diagnose MRSA infection nor to guide or monitor treatment for MRSA infections. Test performance is not FDA approved in patients less than 76 years old. Performed at [facility], [address], [place], [place] [address]     Corean Fireman, MSN, NP-C [facility]  [email or web address] Pager: [phone] Office: [phone] [facility] Main Line: [phone] *Secure Chat Communication Welcome  Total Encounter Time: 35 min

## 2023-12-27 NOTE — Discharge Instructions (Signed)
 Follow with Primary MD [name] HERO., MD in 7 days   Get CBC, CMP, checked  by Primary MD next visit.    Activity: As tolerated with Full fall precautions use walker/cane & assistance as needed   Disposition Home    Diet: Heart healthy   On your next visit with your primary care physician please Get Medicines reviewed and adjusted.   Please request your Prim.MD to go over all Hospital Tests and Procedure/Radiological results at the follow up, please get all Hospital records sent to your Prim MD by signing hospital release before you go home.   If you experience worsening of your admission symptoms, develop shortness of breath, life threatening emergency, suicidal or homicidal thoughts you must seek medical attention immediately by calling 911 or calling your MD immediately  if symptoms less severe.  You Must read complete instructions/literature along with all the possible adverse reactions/side effects for all the Medicines you take and that have been prescribed to you. Take any new Medicines after you have completely understood and accpet all the possible adverse reactions/side effects.   Do not drive, operating heavy machinery, perform activities at heights, swimming or participation in water activities or provide baby sitting services if your were admitted for syncope or siezures until you have seen by Primary MD or a Neurologist and advised to do so again.  Do not drive when taking Pain medications.    Do not take more than prescribed Pain, Sleep and Anxiety Medications  Special Instructions: If you have smoked or chewed Tobacco  in the last 2 yrs please stop smoking, stop any regular Alcohol  and or any Recreational drug use.  Wear Seat belts while driving.   Please note  You were cared for by a hospitalist during your hospital stay. If you have any questions about your discharge medications or the care you received while you were in the hospital after you are  discharged, you can call the unit and asked to speak with the hospitalist on call if the hospitalist that took care of you is not available. Once you are discharged, your primary care physician will handle any further medical issues. Please note that NO REFILLS for any discharge medications will be authorized once you are discharged, as it is imperative that you return to your primary care physician (or establish a relationship with a primary care physician if you do not have one) for your aftercare needs so that they can reassess your need for medications and monitor your lab values.

## 2023-12-27 NOTE — TOC Progression Note (Signed)
 Transition of Care [facility]) - Progression Note    Patient Details  Name: Jeffrey [name] MRN: [id number] Date of Birth: 01/02/1956  Transition of Care [facility]) CM/SW Contact  Marval Gell, RN Phone Number: 12/27/2023, 2:10 PM  Clinical Narrative:     Plan for home IV abx.  Attempt to set up [facility] services, HH RN in [place] with [place] Medicare is a barrier b'c it isn't covered. Spoke w Holley Laundry who is going to see if [name] can cover.  Waiting on PICC and education for administering IV Abx .  Tentative plan is for patient to DC tomorrow after he gets his doses here and then to start [facility] on Friday pending accepting company   Expected Discharge Plan: Home/Self Care Barriers to Discharge: Continued Medical Work up               Expected Discharge Plan and Services   Discharge Planning Services: CM Consult   Living arrangements for the past 2 months: Single Family Home                                       Social Drivers of Health (SDOH) Interventions SDOH Screenings   Food Insecurity: No Food Insecurity (12/26/2023)  Housing: Low Risk  (12/26/2023)  Transportation Needs: No Transportation Needs (12/26/2023)  Utilities: Not At Risk (12/26/2023)  Social Connections: Unknown (12/26/2023)  Tobacco Use: High Risk (12/25/2023)    Readmission Risk Interventions     No data to display

## 2023-12-27 NOTE — Evaluation (Signed)
 Physical Therapy Brief Evaluation and Discharge Note Patient Details Name: Jeffrey [name] MRN: [id number] DOB: 1955-10-23 Today's Date: 12/27/2023   History of Present Illness  Jeffrey [name] is a 68 y.o. male presenting with infected right third digit, found to have acute osteomyelitis. Pt declining surgery. PMH: HTN, hyperlipidemia, GERD, diabetes, gout, anxiety, depression, obesity, nonalcoholic fatty liver disease, lumbar spine disease   Clinical Impression  Pt admitted with above. Pt functioning at baseline. Pt does not require AD for mobilization. Pt with good home set up and support. Pt with no further acute PT needs at this time. PT SIGNING OFF. Please re-consult if needed in future.       PT Assessment Patient does not need any further PT services  Assistance Needed at Discharge  None    Equipment Recommendations None recommended by PT  Recommendations for Other Services       Precautions/Restrictions Precautions Precautions: Other (comment) Precaution/Restrictions Comments: R ulcer/necrosis to tip of R middle finger Restrictions Weight Bearing Restrictions Per Provider Order: No        Mobility  Bed Mobility       General bed mobility comments: pt received in bathroom, suspect indep, no difficulty  Transfers Overall transfer level: Independent Equipment used: None               General transfer comment: no difficulty or LOB    Ambulation/Gait Ambulation/Gait assistance: Independent Gait Distance (Feet): 300 Feet Assistive device: None Gait Pattern/deviations: WFL(Within Functional Limits) Gait Speed: Pace WFL General Gait Details: no LOB  Home Activity Instructions    Stairs            Modified Rankin (Stroke Patients Only)        Balance Overall balance assessment: No apparent balance deficits (not formally assessed)               Standardized Balance Assessment Standardized Balance Assessment : Dynamic Gait  Index Dynamic Gait Index Level Surface: Normal Change in Gait Speed: Normal Gait with Horizontal Head Turns: Normal Gait with Vertical Head Turns: Normal Gait and Pivot Turn: Normal Step Over Obstacle: Normal Step Around Obstacles: Normal Steps: Mild Impairment Total Score: 23      Pertinent Vitals/Pain PT - Brief Vital Signs All Vital Signs Stable: Yes Pain Assessment Pain Assessment: Faces Faces Pain Scale: Hurts little more Pain Location: chronic back pain Pain Intervention(s): Premedicated before session     Home Living Family/patient expects to be discharged to:: Private residence Living Arrangements: Spouse/significant other Available Help at Discharge: Family;Available 24 hours/day Home Environment: Level entry   Home Equipment: Agricultural consultant (2 wheels);Cane - single point;Shower seat        Prior Function Level of Independence: Independent Comments: works part time in Air[employer] pilot, drives, mows grass    UE/LE Assessment   UE ROM/Strength/Tone/Coordination: [employer]    LE ROM/Strength/Tone/Coordination: [employer]      Communication   Communication Communication: No apparent difficulties     Cognition Overall Cognitive Status: Appears within functional limits for tasks assessed/performed       General Comments General comments (skin integrity, edema, etc.): VSS, independent with hygiene post toileting    Exercises     Assessment/Plan    PT Problem List         PT Visit Diagnosis Pain    No Skilled PT All education completed;Patient at baseline level of functioning   Co-evaluation                AMPAC  6 Clicks Help needed turning from your back to your side while in a flat bed without using bedrails?: None Help needed moving from lying on your back to sitting on the side of a flat bed without using bedrails?: None Help needed moving to and from a bed to a chair (including a wheelchair)?: None Help needed standing up from a chair using your arms  (e.g., wheelchair or bedside chair)?: None Help needed to walk in hospital room?: None Help needed climbing 3-5 steps with a railing? : A Little 6 Click Score: 23      End of Session   Activity Tolerance: Patient tolerated treatment well Patient left: in bed;with call bell/phone within reach (sitting EOB) Nurse Communication: Mobility status (no further PT needs) PT Visit Diagnosis: Pain Pain - Right/Left:  (back)     Time: 9084-9074 PT Time Calculation (min) (ACUTE ONLY): 10 min  Charges:   PT Evaluation $PT Eval Low Complexity: 1 Low      Jeffrey [name], PT, DPT Acute Rehabilitation Services Secure chat preferred Office #: [phone]   Jeffrey [name]  12/27/2023, 9:31 AM

## 2023-12-27 NOTE — Progress Notes (Signed)
 OT Cancellation Note  Patient Details Name: Jeffrey [name] MRN: [id number] DOB: 26-Sep-1955   Cancelled Treatment:    Reason Eval/Treat Not Completed: OT screened, no needs identified, will sign off  [name] 12/27/2023, 12:34 PM

## 2023-12-27 NOTE — Progress Notes (Signed)
 PROGRESS NOTE                                                                                                                                                                                                             Patient Demographics:    Jeffrey [name], is a 68 y.o. male, DOB - 05/06/1955, FMW:[id number]  Outpatient Primary MD for the patient is [name] HERO., MD    LOS - 2  Admit date - 12/25/2023    No chief complaint on file.      Brief Narrative (HPI from H&P)   68 y.o. male with medical history significant of hypertension, hyperlipidemia, GERD, diabetes, gout, anxiety, depression, obesity, nonalcoholic fatty liver disease, lumbar spine disease presenting with infected right third digit which has been like that for about 3 to 4 weeks, no fever or chills presented to see the hand surgeon Dr. Delene on 12/25/2023 and was sent to the hospital for antibiotic treatment.   Subjective:    Jeffrey [name] today has, no headache, no fever, no chills, right middle finger discomfort is controlled  Assessment  & Plan :   Osteomyelitis of right middle finger with ulcer at the tip send for the last 3 to 4 weeks -Was seen at the hand surgery office on 12/25/2023 and sent to the hospital for IV antibiotics to see if it would help, he is currently on empiric IV antibiotics, no leukocytosis or fever, will continue antibiotics obtain ID input.  - hand surgeon Dr. Delene on 12/26/2023 but greatly appreciated, patient currently would prefer none surgical intervention -Podiatry appreciated, recommendation for IV antibiotics x 6 weeks, daptomycin and Rocephin with oral Flagyl x 6 weeks, PICC line request ordered  Hypertension - Continue home amlodipine, losartan-hydrochlorothiazide   Hyperlipidemia - Continue home atorvastatin   GERD - Continue PPI and Pepcid   CKD 3B. Outpatient creatinine from 10 days ago appears to be  close to 3, creatinine has improved somewhat, monitor, this could be his new baseline.  Could have underlying diabetic nephropathy.  Will monitor.   -Recovering from AKI on CKD which has been managed by his PCP as an outpatient given dehydration due to nausea and vomiting in the setting of Ozempic use  Anxiety Depression - Continue home duloxetine, bupropion, doxepin   Obesity class III BMI of 36.61 with comorbidities - Noted -  He is on Ozempic as an outpatient, has been held as an outpatient given it developed AKI from nausea and vomiting and poor oral intake.  Lumbar spine disease - Continue home pain medication: Methadone and PRN Oxycodone  Diabetes - SSI  Lab Results  Component Value Date   HGBA1C 6.1 (H) 09/03/2007   CBG (last 3)  Recent Labs    12/26/23 2129 12/27/23 0748 12/27/23 1234  GLUCAP 86 134* 163*         Condition - Extremely Guarded  Family Communication  : None present  Code Status : Full code  Consults  : Hydrographic surveyor Dr.Chiaramonti consulted on 12/25/2023, ID  PUD Prophylaxis : PPI   Procedures  :            Disposition Plan  :    Status is: Inpatient  DVT Prophylaxis  :  Heparin  heparin injection 5,000 Units Start: 12/26/23 0900 SCDs Start: 12/25/23 1720    Lab Results  Component Value Date   PLT 535 (H) 12/27/2023    Diet :  Diet Order             Diet regular Room service appropriate? Yes; Fluid consistency: Thin  Diet effective now                    Inpatient Medications  Scheduled Meds:  amLODipine  10 mg Oral Daily   buPROPion  100 mg Oral BID   doxepin  10 mg Oral QHS   DULoxetine  60 mg Oral BID   famotidine  20 mg Oral Daily   fenofibrate  160 mg Oral Daily   heparin injection (subcutaneous)  5,000 Units Subcutaneous Q8H   hydrochlorothiazide  25 mg Oral Daily   insulin aspart  0-9 Units Subcutaneous TID WC   irbesartan  300 mg Oral Daily   methadone  10 mg Oral Q8H   metroNIDAZOLE  500 mg Oral  Q12H   pantoprazole  40 mg Oral Daily   sodium chloride flush  3 mL Intravenous Q12H   Continuous Infusions:  cefTRIAXone (ROCEPHIN)  IV 2 g (12/27/23 1244)   DAPTOmycin 600 mg (12/27/23 1402)   PRN Meds:.acetaminophen **OR** acetaminophen, oxyCODONE-acetaminophen, polyethylene glycol  Antibiotics  :    Anti-infectives (From admission, onward)    Start     Dose/Rate Route Frequency Ordered Stop   12/27/23 1245  DAPTOmycin (CUBICIN) 600 mg in sodium chloride 0.9 % IVPB        8 mg/kg  76.8 kg (Adjusted) 124 mL/hr over 30 Minutes Intravenous Daily 12/27/23 1157 02/05/24 2359   12/27/23 1245  cefTRIAXone (ROCEPHIN) 2 g in sodium chloride 0.9 % 100 mL IVPB        2 g 200 mL/hr over 30 Minutes Intravenous Every 24 hours 12/27/23 1157 02/05/24 2359   12/27/23 1245  metroNIDAZOLE (FLAGYL) tablet 500 mg        500 mg Oral Every 12 hours 12/27/23 1157 02/05/24 2359   12/27/23 0615  vancomycin (VANCOREADY) IVPB 1250 mg/250 mL  Status:  Discontinued       Placed in Followed by Linked Group   1,250 mg 166.7 mL/hr over 90 Minutes Intravenous Every 36 hours 12/25/23 1813 12/26/23 1306   12/27/23 0000  cefTRIAXone (ROCEPHIN) IVPB        2 g Intravenous Every 24 hours 12/27/23 1212 02/05/24 2359   12/27/23 0000  daptomycin (CUBICIN) IVPB        600 mg Intravenous Every 24  hours 12/27/23 1212 02/05/24 2359   12/27/23 0000  metroNIDAZOLE (FLAGYL) 500 MG tablet        500 mg Oral 2 times daily 12/27/23 1522 02/05/24 2359   12/26/23 1800  vancomycin (VANCOREADY) IVPB 750 mg/150 mL  Status:  Discontinued        750 mg 150 mL/hr over 60 Minutes Intravenous Every 24 hours 12/26/23 1306 12/27/23 1156   12/25/23 2300  ceFEPIme (MAXIPIME) 2 g in sodium chloride 0.9 % 100 mL IVPB  Status:  Discontinued        2 g 200 mL/hr over 30 Minutes Intravenous Every 12 hours 12/25/23 1813 12/27/23 1156   12/25/23 1815  vancomycin (VANCOREADY) IVPB 1500 mg/300 mL       Placed in Followed by Linked Group    1,500 mg 150 mL/hr over 120 Minutes Intravenous  Once 12/25/23 1813 12/25/23 2058   12/25/23 1645  vancomycin (VANCOREADY) IVPB 1500 mg/300 mL  Status:  Discontinued        1,500 mg 150 mL/hr over 120 Minutes Intravenous  Once 12/25/23 1638 12/25/23 1813   12/25/23 1645  piperacillin-tazobactam (ZOSYN) IVPB 3.375 g  Status:  Discontinued        3.375 g 12.5 mL/hr over 240 Minutes Intravenous Once 12/25/23 1642 12/25/23 1729   12/25/23 1630  vancomycin (VANCOCIN) IVPB 1000 mg/200 mL premix  Status:  Discontinued        1,000 mg 200 mL/hr over 60 Minutes Intravenous  Once 12/25/23 1629 12/25/23 1638         Objective:   Vitals:   12/27/23 0744 12/27/23 1055 12/27/23 1230 12/27/23 1535  BP: (!) 129/56 (!) 141/80 132/77 130/77  Pulse: 71  86 98  Resp:      Temp: (!) 97.2 F (36.2 C)  97.9 F (36.6 C) 98 F (36.7 C)  TempSrc: Oral  Oral Oral  SpO2:      Weight:      Height:        Wt Readings from Last 3 Encounters:  12/25/23 99.8 kg  02/25/16 115.2 kg  02/05/16 115.2 kg    No intake or output data in the 24 hours ending 12/27/23 1537    Physical Exam  Awake, alert, no apparent distress Good air entry Regular rate and rhythm Right middle finger as below swollen, red with dead necrotic tip       Data Review:    Recent Labs  Lab 12/25/23 1544 12/26/23 0511 12/27/23 0302  WBC 10.1 7.9 8.6  HGB 9.4* 8.3* 8.7*  HCT 30.0* 25.7* 27.8*  PLT 581* 488* 535*  MCV 86.0 86.0 86.6  MCH 26.9 27.8 27.1  MCHC 31.3 32.3 31.3  RDW 15.9* 15.9* 15.9*  LYMPHSABS 2.1  --  3.2  MONOABS 0.6  --  0.7  EOSABS 0.1  --  0.2  BASOSABS 0.1  --  0.1    Recent Labs  Lab 12/25/23 1544 12/26/23 0511 12/26/23 0616 12/27/23 0302  NA 134* 136  --  133*  K 5.3* 4.3  --  4.4  CL 97* 101  --  98  CO2 25 24  --  24  ANIONGAP 12 11  --  11  GLUCOSE 132* 90  --  85  BUN 23 22  --  18  CREATININE 1.74* 1.63*  --  1.54*  AST  --  22  --   --   ALT  --  32  --   --   ALKPHOS   --  36*  --   --   BILITOT  --  0.6  --   --   ALBUMIN  --  2.7*  --   --   CRP  --   --   --  2.7*  INR  --   --  1.0  --   CALCIUM 9.4 8.9  --  8.8*      Recent Labs  Lab 12/25/23 1544 12/26/23 0511 12/26/23 0616 12/27/23 0302  CRP  --   --   --  2.7*  INR  --   --  1.0  --   CALCIUM 9.4 8.9  --  8.8*    --------------------------------------------------------------------------------------------------------------- Lab Results  Component Value Date   CHOL 330 (HH) 08/02/2007   HDL 73.5 08/02/2007   LDLDIRECT 231.8 08/02/2007   TRIG 312 (HH) 08/02/2007   CHOLHDL 4.5 CALC 08/02/2007    Lab Results  Component Value Date   HGBA1C 6.1 (H) 09/03/2007   No results for input(s): TSH, T4TOTAL, FREET4, T3FREE, THYROIDAB in the last 72 hours. No results for input(s): VITAMINB12, FOLATE, FERRITIN, TIBC, IRON, RETICCTPCT in the last 72 hours. ------------------------------------------------------------------------------------------------------------------ Cardiac Enzymes No results for input(s): CKMB, TROPONINI, MYOGLOBIN in the last 168 hours.  Invalid input(s): CK  Micro Results Recent Results (from the past 240 hours)  MRSA Next Gen by PCR, Nasal     Status: None   Collection Time: 12/26/23  8:17 AM   Specimen: Nasal Mucosa; Nasal Swab  Result Value Ref Range Status   MRSA by PCR Next Gen NOT DETECTED NOT DETECTED Final    Comment: (NOTE) The GeneXpert MRSA Assay (FDA approved for NASAL specimens only), is one component of a comprehensive MRSA colonization surveillance program. It is not intended to diagnose MRSA infection nor to guide or monitor treatment for MRSA infections. Test performance is not FDA approved in patients less than 93 years old. Performed at [facility], [address] [place], [place] [address]     Radiology Report US  EKG SITE RITE Result Date: 12/27/2023 If Site Rite image not attached, placement could  not be confirmed due to current cardiac rhythm.  US  RENAL Result Date: 12/26/2023 CLINICAL DATA:  AKI. EXAM: RENAL / URINARY TRACT ULTRASOUND COMPLETE COMPARISON:  None Available. FINDINGS: Right Kidney: Renal measurements: 12.9 x 7.9 x 6.3 cm = volume: 336 mL. Echogenicity within normal limits. No mass or hydronephrosis visualized. Left Kidney: Renal measurements: 11.5 x 6.5 x 6 cm = volume: 236 mL. Echogenicity within normal limits. No hydronephrosis. Simple appearing left renal cyst measures up to 1.7 cm. Bladder: Appears normal for degree of bladder distention. Other: Limited evaluation of the gallbladder demonstrates shadowing echogenic foci compatible with gallstones measuring up to 2.5 cm. IMPRESSION: 1. No acute sonographic abnormality of the kidneys. 2. Simple appearing left renal cyst. 3. Cholelithiasis. Electronically Signed   By: Harrietta Sherry M.D.   On: 12/26/2023 14:42   DG Finger Middle Right Result Date: 12/25/2023 CLINICAL DATA:  Suspected osteomyelitis of the third right finger. EXAM: RIGHT MIDDLE FINGER 2+V COMPARISON:  None Available. FINDINGS: No evidence of an acute fracture or dislocation. There is cortical destruction of the tuft of the distal phalanx of the third right finger. Or small foci of adjacent soft tissue air are seen. Diffuse soft tissue swelling and a distal soft tissue defect are also noted. IMPRESSION: Findings consistent with acute osteomyelitis involving the tuft of the distal phalanx of the third right finger. Electronically Signed   By:  Suzen Dials M.D.   On: 12/25/2023 16:15     Signature  -   Brayton Lye M.D on 12/27/2023 at 3:37 PM   -  To page go to [email or web address]

## 2023-12-27 NOTE — Plan of Care (Signed)
   Problem: Education: Goal: Knowledge of General Education information will improve Description Including pain rating scale, medication(s)/side effects and non-pharmacologic comfort measures Outcome: Progressing   Problem: Health Behavior/Discharge Planning: Goal: Ability to manage health-related needs will improve Outcome: Progressing

## 2023-12-27 NOTE — Progress Notes (Signed)
 PHARMACY CONSULT NOTE FOR:  OUTPATIENT  PARENTERAL ANTIBIOTIC THERAPY (OPAT)  Indication: Finger osteomyelitis Regimen: Ceftriaxone IV 2g q24h + Daptomycin IV 600 mg q24h - Metronidazole PO 500 mg q12h  End date: 02/05/2024  IV antibiotic discharge orders are pended. To discharging provider:  please sign these orders via discharge navigator,  Select New Orders & click on the button choice - Manage This Unsigned Work.    Thank you for allowing pharmacy to be a part of this patient's care.  Feliciano Close, PharmD PGY2 Infectious Diseases Pharmacy Resident  12/27/2023 12:17 PM

## 2023-12-28 ENCOUNTER — Other Ambulatory Visit (HOSPITAL_COMMUNITY): Payer: Self-pay

## 2023-12-28 DIAGNOSIS — M869 Osteomyelitis, unspecified: Secondary | ICD-10-CM | POA: Diagnosis not present

## 2023-12-28 LAB — CBC
HCT: 26.7 % — ABNORMAL LOW (ref 39.0–52.0)
Hemoglobin: 8.3 g/dL — ABNORMAL LOW (ref 13.0–17.0)
MCH: 26.9 pg (ref 26.0–34.0)
MCHC: 31.1 g/dL (ref 30.0–36.0)
MCV: 86.4 fL (ref 80.0–100.0)
Platelets: 553 K/uL — ABNORMAL HIGH (ref 150–400)
RBC: 3.09 MIL/uL — ABNORMAL LOW (ref 4.22–5.81)
RDW: 15.9 % — ABNORMAL HIGH (ref 11.5–15.5)
WBC: 9.3 K/uL (ref 4.0–10.5)
nRBC: 0 % (ref 0.0–0.2)

## 2023-12-28 LAB — GLUCOSE, CAPILLARY
Glucose-Capillary: 124 mg/dL — ABNORMAL HIGH (ref 70–99)
Glucose-Capillary: 140 mg/dL — ABNORMAL HIGH (ref 70–99)

## 2023-12-28 LAB — BASIC METABOLIC PANEL WITH GFR
Anion gap: 8 (ref 5–15)
BUN: 22 mg/dL (ref 8–23)
CO2: 26 mmol/L (ref 22–32)
Calcium: 8.8 mg/dL — ABNORMAL LOW (ref 8.9–10.3)
Chloride: 97 mmol/L — ABNORMAL LOW (ref 98–111)
Creatinine, Ser: 1.93 mg/dL — ABNORMAL HIGH (ref 0.61–1.24)
GFR, Estimated: 37 mL/min — ABNORMAL LOW (ref >60)
Glucose, Bld: 93 mg/dL (ref 70–99)
Potassium: 4.4 mmol/L (ref 3.5–5.1)
Sodium: 131 mmol/L — ABNORMAL LOW (ref 135–145)

## 2023-12-28 MED ORDER — SODIUM CHLORIDE 0.9% FLUSH
10.0000 mL | Freq: Two times a day (BID) | INTRAVENOUS | Status: DC
  Administered 2023-12-28: 10 mL

## 2023-12-28 MED ORDER — SODIUM CHLORIDE 0.9% FLUSH: 10.0000 mL | INTRAVENOUS | Status: DC | PRN

## 2023-12-28 MED ORDER — CHLORHEXIDINE GLUCONATE CLOTH 2 % EX PADS
6.0000 | MEDICATED_PAD | Freq: Every day | CUTANEOUS | Status: DC
  Administered 2023-12-28: 6 via TOPICAL

## 2023-12-28 MED ORDER — PNEUMOCOCCAL 20-VAL CONJ VACC 0.5 ML IM SUSY
0.5000 mL | PREFILLED_SYRINGE | INTRAMUSCULAR | Status: DC
Start: 2023-12-29 — End: 2023-12-28
  Filled 2023-12-28: qty 0.5

## 2023-12-28 NOTE — Discharge Summary (Signed)
 Physician Discharge Summary  Jeffrey [name] FMW:[id number] DOB: 08/31/1955 DOA: 12/25/2023  PCP: Jeffrey [name] HERO., MD  Admit date: 12/25/2023 Discharge date: 12/28/2023  Admitted From: (Home) Disposition:  (Home)  Recommendations for Outpatient Follow-up:  Follow up with PCP in 1-2 weeks Please obtain BMP/CBC in one week  ID OPAT recommendations on discharge   Discharge antibiotics to be given via PICC line:   Daptomycin IV + Ceftriaxone 2 gm IV              +  Oral Metronidazole 500 mg BID    Duration:  6 weeks    End Date: November 10th    [facility] Care Per Protocol with Biopatch Use: Home health RN for IV administration and teaching, line care and labs.     Labs weekly while on IV antibiotics: _x_ CBC with differential __ BMP **TWICE WEEKLY ON VANCOMYCIN  __ CMP _x_ CRP _x_ ESR __ Vancomycin trough TWICE WEEKLY _x_ CK   _x_ Please pull PIC at completion of IV antibiotics __ Please leave PIC in place until doctor has seen patient or been notified   Fax weekly labs to [phone] 10/22 @ 10: 30 am with Dr. Dennise     Diet recommendation: Heart Healthy / Carb Modified  Brief/Interim Summary: 68 y.o. male with medical history significant of hypertension, hyperlipidemia, GERD, diabetes, gout, anxiety, depression, obesity, nonalcoholic fatty liver disease, lumbar spine disease presenting with infected right third digit which has been like that for about 3 to 4 weeks, no fever or chills presented to see the hand surgeon Dr. Delene on 12/25/2023 and was sent to the hospital for antibiotic treatment.   Osteomyelitis of right middle finger with ulcer at the tip send for the last 3 to 4 weeks -Was seen at the hand surgery office on 12/25/2023 and sent to the hospital for IV antibiotics to see if it would help, he is currently on empiric IV antibiotics, no leukocytosis or fever. - hand surgeon Dr. Delene on 12/26/2023 input greatly appreciated, patient currently would  prefer none surgical intervention - ID consult appreciated, recommendation for IV antibiotics x 6 weeks, daptomycin and Rocephin with oral Flagyl x 6 weeks, PICC line was inserted this morning.  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  Could have underlying diabetic nephropathy.  Will monitor.   -Recovering from AKI on CKD which has been managed by his PCP as an outpatient given dehydration due to nausea and vomiting in the setting of Ozempic use  Hyponatremia -Mild, symptomatic   Anxiety Depression - Continue home duloxetine, bupropion, doxepin   Obesity class III BMI of 36.61 with comorbidities - Noted -He is on Ozempic as an outpatient, has been held as an outpatient given it developed AKI from nausea and vomiting and poor oral intake.   Lumbar spine disease - Continue home pain medication: Methadone and PRN Oxycodone   Diabetes - Continue with home regimen  Discharge Diagnoses:  Principal Problem:   Finger osteomyelitis (HCC) Active Problems:   DM (diabetes mellitus) (HCC)   HYPERCHOLESTEROLEMIA   GOUT   Essential hypertension   GERD   Generalized anxiety disorder   NAFLD (nonalcoholic fatty liver disease)   Morbid obesity with BMI of 40.0-44.9,  adult [facility])   Recurrent major depressive disorder, in full remission    Discharge Instructions  Discharge Instructions     [facility] pharmacist to adjust dose for Vancomycin, Aminoglycosides and other anti-infective therapies as requested by physician.   Complete by: As directed    [facility] to provide Cath Flo 2mg    Complete by: As directed    Administer for PICC line occlusion and as ordered by  physician for other access device issues.   Anaphylaxis Kit: Provided to treat any anaphylactic reaction to the medication being provided to the patient if First Dose or when requested by physician   Complete by: As directed    Epinephrine 1mg /ml vial / amp: Administer 0.3mg  (0.59ml) subcutaneously once for moderate to severe anaphylaxis, nurse to call physician and pharmacy when reaction occurs and call 911 if needed for immediate care   Diphenhydramine 50mg /ml IV vial: Administer 25-50mg  IV/IM PRN for first dose reaction, rash, itching, mild reaction, nurse to call physician and pharmacy when reaction occurs   Sodium Chloride 0.9% NS 500ml IV: Administer if needed for hypovolemic blood pressure drop or as ordered by physician after call to physician with anaphylactic reaction   Change dressing on IV access line weekly and PRN   Complete by: As directed    Diet - low sodium heart healthy   Complete by: As directed    Discharge instructions   Complete by: As directed    Follow with Primary MD Jeffrey [name] HERO., MD in 7 days   Get CBC, CMP, checked  by Primary MD next visit.    Activity: As tolerated with Full fall precautions use walker/cane & assistance as needed   Disposition Home    Diet: Heart healthy   On your next visit with your primary care physician please Get Medicines reviewed and adjusted.   Please request your Prim.MD to go over all Hospital Tests and Procedure/Radiological results at the follow up, please get all Hospital records sent to your Prim MD by signing hospital release before you go home.   If you experience worsening of your admission symptoms, develop shortness of breath, life threatening emergency, suicidal or homicidal thoughts you must seek medical attention immediately by calling 911 or calling your MD immediately  if symptoms less severe.  You Must read complete instructions/literature along with all the possible adverse reactions/side effects for all the  Medicines you take and that have been prescribed to you. Take any new Medicines after you have completely understood and accpet all the possible adverse reactions/side effects.   Do not drive, operating heavy machinery, perform activities at heights, swimming or participation in water activities or provide baby sitting services if your were admitted for syncope or siezures until you have seen by Primary MD or a Neurologist and advised to do so again.  Do not drive when taking Pain medications.    Do not take more than prescribed Pain, Sleep and Anxiety Medications  Special Instructions: If you have smoked or chewed Tobacco  in the last 2 yrs please stop smoking, stop any regular Alcohol  and or any Recreational drug use.  Wear Seat belts while driving.   Please note  You were cared for by a hospitalist during your hospital stay. If you have any questions about your discharge medications or the care you received while you were in the hospital after you are discharged, you can call the unit and asked to speak with the hospitalist on call if the  hospitalist that took care of you is not available. Once you are discharged, your primary care physician will handle any further medical issues. Please note that NO REFILLS for any discharge medications will be authorized once you are discharged, as it is imperative that you return to your primary care physician (or establish a relationship with a primary care physician if you do not have one) for your aftercare needs so that they can reassess your need for medications and monitor your lab values.   Flush IV access with Sodium Chloride 0.9% and Heparin 10 units/ml or 100 units/ml   Complete by: As directed    Home infusion instructions - [facility]   Complete by: As directed    Instructions: Flush IV access with Sodium Chloride 0.9% and Heparin 10units/ml or 100units/ml   Change dressing on IV access line: Weekly and PRN   Instructions Cath  Flo 2mg : Administer for PICC Line occlusion and as ordered by physician for other access device   [facility] pharmacist to adjust dose for: Vancomycin, Aminoglycosides and other anti-infective therapies as requested by physician   Increase activity slowly   Complete by: As directed    Method of administration may be changed at the discretion of home infusion pharmacist based upon assessment of the patient and/or caregiver's ability to self-administer the medication ordered   Complete by: As directed    No wound care   Complete by: As directed       Allergies as of 12/28/2023   No Known Allergies      Medication List     TAKE these medications    amLODipine 10 MG tablet Commonly known as: NORVASC Take 10 mg by mouth daily.   buPROPion 100 MG tablet Commonly known as: WELLBUTRIN Take 100 mg by mouth 2 (two) times daily.   cefTRIAXone IVPB Commonly known as: ROCEPHIN Inject 2 g into the vein daily. Indication:  Finger osteomyelitis First Dose: Yes Last Day of Therapy:  02/05/2024 Labs - Once weekly:  CBC/D and BMP, Labs - Once weekly: ESR and CRP Method of administration: IV Push Method of administration may be changed at the discretion of home infusion pharmacist based upon assessment of the patient and/or caregiver's ability to self-administer the medication ordered.   daptomycin IVPB Commonly known as: CUBICIN Inject 600 mg into the vein daily. Indication:  Finger osteomyelitis First Dose: Yes Last Day of Therapy:  02/05/2024 Labs - Once weekly:  CBC/D, BMP, and CPK Labs - Once weekly: ESR and CRP Method of administration: IV Push Method of administration may be changed at the discretion of home infusion pharmacist based upon assessment of the patient and/or caregiver's ability to self-administer the medication ordered.   doxepin 10 MG capsule Commonly known as: SINEQUAN Take 10 mg by mouth at bedtime.   DULoxetine 60 MG capsule Commonly known as:  CYMBALTA Take 60 mg by mouth 2 (two) times daily.   famotidine 20 MG tablet Commonly known as: PEPCID Take 20 mg by mouth daily.   fenofibrate 145 MG tablet Commonly known as: TRICOR Take 145 mg by mouth daily.   LANTUS SOLOSTAR Aneth Inject 35 Units into the skin daily as needed.   metFORMIN 500 MG tablet Commonly known as: GLUCOPHAGE Take 1,000 mg by mouth 2 (two) times daily with a meal.   methadone 10 MG tablet Commonly known as: DOLOPHINE Take 10 mg by mouth 3 (three) times daily.   metroNIDAZOLE 500 MG tablet Commonly known as: FLAGYL Take  1 tablet (500 mg total) by mouth 2 (two) times daily.   omeprazole 40 MG capsule Commonly known as: PRILOSEC Take 40 mg by mouth 2 (two) times daily.   rosuvastatin 20 MG tablet Commonly known as: CRESTOR Take 20 mg by mouth daily.   valsartan-hydrochlorothiazide 320-25 MG tablet Commonly known as: DIOVAN-HCT Take 1 tablet by mouth daily.               Discharge Care Instructions  (From admission, onward)           Start     Ordered   12/27/23 0000  Change dressing on IV access line weekly and PRN  (Home infusion instructions - [facility] )        12/27/23 1212            Follow-up Information     Dennise Kingsley, MD Follow up on 01/17/2024.   Specialty: Infectious Diseases Why: Hospital Discharge Follow Up 10/22 @ 10: 30 am with Dr. Dennise Pass information: [address] [place], [address] [place] [address] [phone]                No Known Allergies  Consultations: ID Hand surgery   Procedures/Studies: US  EKG SITE RITE Result Date: 12/27/2023 If Site Rite image not attached, placement could not be confirmed due to current cardiac rhythm.  US  RENAL Result Date: 12/26/2023 CLINICAL DATA:  AKI. EXAM: RENAL / URINARY TRACT ULTRASOUND COMPLETE COMPARISON:  None Available. FINDINGS: Right Kidney: Renal measurements: 12.9 x 7.9 x 6.3 cm = volume: 336 mL. Echogenicity within  normal limits. No mass or hydronephrosis visualized. Left Kidney: Renal measurements: 11.5 x 6.5 x 6 cm = volume: 236 mL. Echogenicity within normal limits. No hydronephrosis. Simple appearing left renal cyst measures up to 1.7 cm. Bladder: Appears normal for degree of bladder distention. Other: Limited evaluation of the gallbladder demonstrates shadowing echogenic foci compatible with gallstones measuring up to 2.5 cm. IMPRESSION: 1. No acute sonographic abnormality of the kidneys. 2. Simple appearing left renal cyst. 3. Cholelithiasis. Electronically Signed   By: Harrietta Sherry M.D.   On: 12/26/2023 14:42   DG Finger Middle Right Result Date: 12/25/2023 CLINICAL DATA:  Suspected osteomyelitis of the third right finger. EXAM: RIGHT MIDDLE FINGER 2+V COMPARISON:  None Available. FINDINGS: No evidence of an acute fracture or dislocation. There is cortical destruction of the tuft of the distal phalanx of the third right finger. Or small foci of adjacent soft tissue air are seen. Diffuse soft tissue swelling and a distal soft tissue defect are also noted. IMPRESSION: Findings consistent with acute osteomyelitis involving the tuft of the distal phalanx of the third right finger. Electronically Signed   By: Suzen Dials M.D.   On: 12/25/2023 16:15      Subjective: No complaints today, pain is controlled and his finger  Discharge Exam: Vitals:   12/28/23 0734 12/28/23 1150  BP: (!) 143/88   Pulse:    Resp: 18 16  Temp: 97.8 F (36.6 C) (!) 97.5 F (36.4 C)  SpO2: 96%    Vitals:   12/28/23 0047 12/28/23 0436 12/28/23 0734 12/28/23 1150  BP: (!) 105/55 102/69 (!) 143/88   Pulse: 64 61    Resp: 20 19 18 16   Temp: (!) 97.2 F (36.2 C) 97.7 F (36.5 C) 97.8 F (36.6 C) (!) 97.5 F (36.4 C)  TempSrc: Oral Oral Oral Oral  SpO2:   96%   Weight:  Height:        General: Pt is alert, awake, not in acute distress Respiratory: CTA  Extremities: Lower extremities no  edema,     The results of significant diagnostics from this hospitalization (including imaging, microbiology, ancillary and laboratory) are listed below for reference.     Microbiology: Recent Results (from the past 240 hours)  MRSA Next Gen by PCR, Nasal     Status: None   Collection Time: 12/26/23  8:17 AM   Specimen: Nasal Mucosa; Nasal Swab  Result Value Ref Range Status   MRSA by PCR Next Gen NOT DETECTED NOT DETECTED Final    Comment: (NOTE) The GeneXpert MRSA Assay (FDA approved for NASAL specimens only), is one component of a comprehensive MRSA colonization surveillance program. It is not intended to diagnose MRSA infection nor to guide or monitor treatment for MRSA infections. Test performance is not FDA approved in patients less than 46 years old. Performed at [facility], [address], [place], [place] [address]      Labs: BNP (last 3 results) No results for input(s): BNP in the last 8760 hours. Basic Metabolic Panel: Recent Labs  Lab 12/25/23 1544 12/26/23 0511 12/27/23 0302 12/28/23 0259  NA 134* 136 133* 131*  K 5.3* 4.3 4.4 4.4  CL 97* 101 98 97*  CO2 25 24 24 26   GLUCOSE 132* 90 85 93  BUN 23 22 18 22   CREATININE 1.74* 1.63* 1.54* 1.93*  CALCIUM 9.4 8.9 8.8* 8.8*   Liver Function Tests: Recent Labs  Lab 12/26/23 0511  AST 22  ALT 32  ALKPHOS 36*  BILITOT 0.6  PROT 6.3*  ALBUMIN 2.7*   No results for input(s): LIPASE, AMYLASE in the last 168 hours. No results for input(s): AMMONIA in the last 168 hours. CBC: Recent Labs  Lab 12/25/23 1544 12/26/23 0511 12/27/23 0302 12/28/23 0259  WBC 10.1 7.9 8.6 9.3  NEUTROABS 7.0  --  4.2  --   HGB 9.4* 8.3* 8.7* 8.3*  HCT 30.0* 25.7* 27.8* 26.7*  MCV 86.0 86.0 86.6 86.4  PLT 581* 488* 535* 553*   Cardiac Enzymes: Recent Labs  Lab 12/27/23 1204  CKTOTAL 96   BNP: Invalid input(s): POCBNP CBG: Recent Labs  Lab 12/27/23 1234 12/27/23 1649 12/27/23 2149  12/28/23 0844 12/28/23 1152  GLUCAP 163* 149* 233* 124* 140*   D-Dimer No results for input(s): DDIMER in the last 72 hours. Hgb A1c No results for input(s): HGBA1C in the last 72 hours. Lipid Profile No results for input(s): CHOL, HDL, LDLCALC, TRIG, CHOLHDL, LDLDIRECT in the last 72 hours. Thyroid function studies No results for input(s): TSH, T4TOTAL, T3FREE, THYROIDAB in the last 72 hours.  Invalid input(s): FREET3 Anemia work up No results for input(s): VITAMINB12, FOLATE, FERRITIN, TIBC, IRON, RETICCTPCT in the last 72 hours. Urinalysis    Component Value Date/Time   COLORURINE STRAW (A) 12/26/2023 1355   APPEARANCEUR CLEAR 12/26/2023 1355   LABSPEC 1.009 12/26/2023 1355   PHURINE 7.0 12/26/2023 1355   GLUCOSEU NEGATIVE 12/26/2023 1355   HGBUR NEGATIVE 12/26/2023 1355   BILIRUBINUR NEGATIVE 12/26/2023 1355   KETONESUR NEGATIVE 12/26/2023 1355   PROTEINUR 100 (A) 12/26/2023 1355   UROBILINOGEN 0.2 04/12/2007 1120   NITRITE NEGATIVE 12/26/2023 1355   LEUKOCYTESUR NEGATIVE 12/26/2023 1355   Sepsis Labs Recent Labs  Lab 12/25/23 1544 12/26/23 0511 12/27/23 0302 12/28/23 0259  WBC 10.1 7.9 8.6 9.3   Microbiology Recent Results (from the past 240 hours)  MRSA  Next Gen by PCR, Nasal     Status: None   Collection Time: 12/26/23  8:17 AM   Specimen: Nasal Mucosa; Nasal Swab  Result Value Ref Range Status   MRSA by PCR Next Gen NOT DETECTED NOT DETECTED Final    Comment: (NOTE) The GeneXpert MRSA Assay (FDA approved for NASAL specimens only), is one component of a comprehensive MRSA colonization surveillance program. It is not intended to diagnose MRSA infection nor to guide or monitor treatment for MRSA infections. Test performance is not FDA approved in patients less than 67 years old. Performed at [facility], [address], [place], [place] [address]      Time coordinating discharge: Over 30  minutes  SIGNED:   Brayton Lye, MD  [facility] 12/28/2023, 12:10 PM Pager   If 7PM-7AM, please contact night-coverage [email or web address]

## 2023-12-28 NOTE — TOC Progression Note (Signed)
 Transition of Care [facility]) - Progression Note    Patient Details  Name: Jeffrey [name] MRN: [id number] Date of Birth: 1955-05-17  Transition of [facility] CM/SW Contact  Rosalva Jon Bloch, RN Phone Number: 12/28/2023, 11:20 AM  Clinical Narrative:    Pt requiring LT IV ABX therapy @ d/c. Pt resides in [place].  Problems securing home health RN. Waiting on [facility] to confirm they can provide [facility]. IP CP following and will assist with needs...   Expected Discharge Plan: Home w Home Health Services Barriers to Discharge: Continued Medical Work up               Expected Discharge Plan and Services   Discharge Planning Services: CM Consult   Living arrangements for the past 2 months: Single Family Home                                       Social Drivers of Health (SDOH) Interventions SDOH Screenings   Food Insecurity: No Food Insecurity (12/26/2023)  Housing: Low Risk  (12/26/2023)  Transportation Needs: No Transportation Needs (12/26/2023)  Utilities: Not At Risk (12/26/2023)  Social Connections: Unknown (12/26/2023)  Tobacco Use: High Risk (12/25/2023)    Readmission Risk Interventions     No data to display

## 2023-12-28 NOTE — Care Management Important Message (Signed)
 Important Message  Patient Details  Name: Jeffrey [name] MRN: [id number] Date of Birth: April 18, 1955   Important Message Given:  Yes - Medicare IM     Claretta Deed 12/28/2023, 4:31 PM

## 2023-12-28 NOTE — Plan of Care (Signed)
  Problem: Fluid Volume: Goal: Ability to maintain a balanced intake and output will improve Outcome: Adequate for Discharge   Problem: Health Behavior/Discharge Planning: Goal: Ability to manage health-related needs will improve Outcome: Adequate for Discharge   Problem: Metabolic: Goal: Ability to maintain appropriate glucose levels will improve Outcome: Adequate for Discharge   Problem: Skin Integrity: Goal: Risk for impaired skin integrity will decrease Outcome: Adequate for Discharge   Problem: Tissue Perfusion: Goal: Adequacy of tissue perfusion will improve Outcome: Adequate for Discharge

## 2023-12-28 NOTE — Progress Notes (Addendum)
 Discharge Nurse Summary: DC order noted per MD. DC RN at bedside with patient. Patient agreeable with discharge plan, wife at bedside.   AVS printed/reviewed. PICC line CDI w/o s/s infection. No DME needs. No home meds. TOC meds delivered to the patient. Discussed PICC line care, proper storage of medications, flushes, s/s infection, importance of hand hygiene, and not reusing green caps. Informed patient/wife supplies provided may be different than inpatient depending on insurance. Reviewed SASH administration education and necessity of heparin after saline flush given PICC line is clamped.   Patient refused PNA vaccine, states he will obtain with PCP. CP/Edu resolved. Telemonitor returned to charging station. All belongings accounted for. Wound to finger, CDI w/o bleeding or drainage. See LDAs. Patient wheeled downstairs for discharge by private auto.   Rosario EMERSON Lund, RN

## 2023-12-28 NOTE — Progress Notes (Signed)
 Peripherally Inserted Central Catheter Placement  The IV Nurse has discussed with the patient and/or persons authorized to consent for the patient, the purpose of this procedure and the potential benefits and risks involved with this procedure.  The benefits include less needle sticks, lab draws from the catheter, and the patient may be discharged home with the catheter. Risks include, but not limited to, infection, bleeding, blood clot (thrombus formation), and puncture of an artery; nerve damage and irregular heartbeat and possibility to perform a PICC exchange if needed/ordered by physician.  Alternatives to this procedure were also discussed.  Bard Power PICC patient education guide, fact sheet on infection prevention and patient information card has been provided to patient /or left at bedside.    PICC Placement Documentation  PICC Single Lumen 12/28/23 Right Brachial 40 cm 0 cm (Active)  Indication for Insertion or Continuance of Line Prolonged intravenous therapies 12/28/23 0829  Exposed Catheter (cm) 0 cm 12/28/23 0829  Site Assessment Clean, Dry, Intact 12/28/23 0829  Line Status Flushed;Blood return noted;Saline locked 12/28/23 0829  Dressing Type Transparent 12/28/23 0829  Dressing Status Antimicrobial disc/dressing in place 12/28/23 0829  Line Care Connections checked and tightened 12/28/23 0829  Dressing Intervention New dressing 12/28/23 0829  Dressing Change Due 01/04/24 12/28/23 0829       Jeffrey [name] 12/28/2023, 8:31 AM

## 2023-12-28 NOTE — Progress Notes (Signed)
 Patient ID: Jeffrey [name], male   DOB: 07/31/1955, 68 y.o.   MRN: [id number]   LOS: 3 days   Subjective: Doing better, ready to go home.   Objective: Vital signs in last 24 hours: Temp:  [97.2 F (36.2 C)-98 F (36.7 C)] 97.5 F (36.4 C) (10/02 1150) Pulse Rate:  [61-98] 61 (10/02 0436) Resp:  [16-20] 16 (10/02 1150) BP: (102-145)/(55-89) 143/88 (10/02 0734) SpO2:  [95 %-96 %] 96 % (10/02 0734) Last BM Date : 12/26/23   Laboratory  CBC Recent Labs    12/27/23 0302 12/28/23 0259  WBC 8.6 9.3  HGB 8.7* 8.3*  HCT 27.8* 26.7*  PLT 535* 553*   BMET Recent Labs    12/27/23 0302 12/28/23 0259  NA 133* 131*  K 4.4 4.4  CL 98 97*  CO2 24 26  GLUCOSE 85 93  BUN 18 22  CREATININE 1.54* 1.93*  CALCIUM 8.8* 8.8*     Physical Exam General appearance: alert and no distress Right long finger -- Erythema, edema improved. Continues with tip ischemia.   Assessment/Plan: Right long finger osteo -- Continue IV abx. F/u with Dr. Delene in 1-2 weeks.    Ozell DOROTHA Ned, PA-C Orthopedic Surgery [phone] 12/28/2023

## 2023-12-28 NOTE — Plan of Care (Signed)
  Problem: Coping: Goal: Ability to adjust to condition or change in health will improve Outcome: Progressing   Problem: Health Behavior/Discharge Planning: Goal: Ability to identify and utilize available resources and services will improve Outcome: Progressing Goal: Ability to manage health-related needs will improve Outcome: Progressing   Problem: Skin Integrity: Goal: Risk for impaired skin integrity will decrease Outcome: Progressing   Problem: Clinical Measurements: Goal: Will remain free from infection Outcome: Progressing   Problem: Pain Managment: Goal: General experience of comfort will improve and/or be controlled Outcome: Progressing   Problem: Skin Integrity: Goal: Risk for impaired skin integrity will decrease Outcome: Progressing

## 2024-01-11 ENCOUNTER — Other Ambulatory Visit: Payer: Self-pay

## 2024-01-11 ENCOUNTER — Encounter (HOSPITAL_COMMUNITY): Payer: Self-pay

## 2024-01-11 ENCOUNTER — Emergency Department (HOSPITAL_COMMUNITY)
Admission: EM | Admit: 2024-01-11 | Discharge: 2024-01-11 | Disposition: A | Discharge status: Home / Self Care | Attending: Emergency Medicine | Admitting: Emergency Medicine

## 2024-01-11 DIAGNOSIS — E109 Type 1 diabetes mellitus without complications: Secondary | ICD-10-CM | POA: Diagnosis not present

## 2024-01-11 DIAGNOSIS — Z79899 Other long term (current) drug therapy: Secondary | ICD-10-CM | POA: Diagnosis not present

## 2024-01-11 DIAGNOSIS — I10 Essential (primary) hypertension: Secondary | ICD-10-CM | POA: Insufficient documentation

## 2024-01-11 DIAGNOSIS — T82898A Other specified complication of vascular prosthetic devices, implants and grafts, initial encounter: Secondary | ICD-10-CM | POA: Diagnosis present

## 2024-01-11 DIAGNOSIS — Y713 Surgical instruments, materials and cardiovascular devices (including sutures) associated with adverse incidents: Secondary | ICD-10-CM | POA: Insufficient documentation

## 2024-01-11 DIAGNOSIS — Z794 Long term (current) use of insulin: Secondary | ICD-10-CM | POA: Diagnosis not present

## 2024-01-11 DIAGNOSIS — T829XXA Unspecified complication of cardiac and vascular prosthetic device, implant and graft, initial encounter: Secondary | ICD-10-CM

## 2024-01-11 MED ORDER — SODIUM CHLORIDE 0.9 % IV SOLN
2.0000 g | Freq: Once | INTRAVENOUS | Status: AC
  Administered 2024-01-11: 2 g via INTRAVENOUS
  Filled 2024-01-11: qty 20

## 2024-01-11 MED ORDER — SODIUM CHLORIDE 0.9 % IV SOLN
600.0000 mg | Freq: Every day | INTRAVENOUS | Status: DC
  Administered 2024-01-11: 600 mg via INTRAVENOUS
  Filled 2024-01-11: qty 12

## 2024-01-11 NOTE — Progress Notes (Signed)
 IV team consult received for PICC line assessment on PICC placed during previous admission. PICC was sitting approx 37cm outside of insertion site. (Total length of catheter was 40cm). MD messaged and notified. Line was removed shortly after PICC line removal order was placed. Skin cleansed with CHG. Vaseline gauze and tegaderm used over insertion site after removal. No bleeding noted, although patient endorsed moderate bleeding from PICC site at home.   Pending new PICC placement order to place new PICC.

## 2024-01-11 NOTE — ED Triage Notes (Signed)
 Pt has PICC line to right arm, pt states there was blood and medication leaking from PICC. Denies pain. Axox4.

## 2024-01-11 NOTE — ED Provider Notes (Signed)
 [place] EMERGENCY DEPARTMENT AT [facility] Provider Note   CSN: [id number] Arrival date & time: 01/11/24  1223     Patient presents with: Vascular Access Problem   Jeffrey [name] is a 68 y.o. male.  HPI Patient is a 68 year old male with PMH of hypertension, HLD, GERD, obesity, nonalcoholic fatty liver disease, insulin-dependent diabetes, and osteomyelitis of right middle finger initiated on 6-week course of daptomycin and ceftriaxone IV x 6 weeks 12/28/23 presenting to the ED with chief complaint of concern for leakage from PICC line beginning last night.  Patient reports that he has not received his IV antibiotics this morning subsequently.  He denies recent headaches, vision changes, chest pain, shortness of breath, fevers, chills, nausea, vomiting, abdominal pain, redness, or swelling from PICC site.    Prior to Admission medications   Medication Sig Start Date End Date Taking? Authorizing Provider  amLODipine (NORVASC) 10 MG tablet Take 10 mg by mouth daily.    [provider]  buPROPion (WELLBUTRIN) 100 MG tablet Take 100 mg by mouth 2 (two) times daily.    [provider]  cefTRIAXone (ROCEPHIN) IVPB Inject 2 g into the vein daily. Indication:  Finger osteomyelitis First Dose: Yes Last Day of Therapy:  02/05/2024 Labs - Once weekly:  CBC/D and BMP, Labs - Once weekly: ESR and CRP Method of administration: IV Push Method of administration may be changed at the discretion of home infusion pharmacist based upon assessment of the patient and/or caregiver's ability to self-administer the medication ordered. 12/27/23 02/05/24  Evelynn Evalene HERO, MD  daptomycin (CUBICIN) IVPB Inject 600 mg into the vein daily. Indication:  Finger osteomyelitis First Dose: Yes Last Day of Therapy:  02/05/2024 Labs - Once weekly:  CBC/D, BMP, and CPK Labs - Once weekly: ESR and CRP Method of administration: IV Push Method of administration may be changed at the  discretion of home infusion pharmacist based upon assessment of the patient and/or caregiver's ability to self-administer the medication ordered. 12/27/23 02/05/24  Evelynn Evalene HERO, MD  doxepin (SINEQUAN) 10 MG capsule Take 10 mg by mouth at bedtime.    [provider]  DULoxetine (CYMBALTA) 60 MG capsule Take 60 mg by mouth 2 (two) times daily.    [provider]  famotidine (PEPCID) 20 MG tablet Take 20 mg by mouth daily.    [provider]  fenofibrate (TRICOR) 145 MG tablet Take 145 mg by mouth daily.    [provider]  Insulin Glargine (LANTUS SOLOSTAR Elliott) Inject 35 Units into the skin daily as needed.    [provider]  metFORMIN (GLUCOPHAGE) 500 MG tablet Take 1,000 mg by mouth 2 (two) times daily with a meal.    [provider]  methadone (DOLOPHINE) 10 MG tablet Take 10 mg by mouth 3 (three) times daily.    [provider]  metroNIDAZOLE (FLAGYL) 500 MG tablet Take 1 tablet (500 mg total) by mouth 2 (two) times daily. 12/27/23 02/06/24  Evelynn Evalene HERO, MD  omeprazole (PRILOSEC) 40 MG capsule Take 40 mg by mouth 2 (two) times daily.    [provider]  rosuvastatin (CRESTOR) 20 MG tablet Take 20 mg by mouth daily.    [provider]  valsartan-hydrochlorothiazide (DIOVAN-HCT) 320-25 MG tablet Take 1 tablet by mouth daily.    [provider]    Allergies: Patient has no known allergies.    Review of Systems  Updated Vital Signs BP (!) 162/88   Pulse 92  Temp (!) 97.4 F (36.3 C)   Resp 18   Ht 5' 5 (1.651 m)   Wt 95.3 kg   SpO2 98%   BMI 34.95 kg/m   Physical Exam Constitutional:      Appearance: Normal appearance.  HENT:     Head: Normocephalic and atraumatic.     Nose: Nose normal.     Mouth/Throat:     Mouth: Mucous membranes are moist.     Pharynx: Oropharynx is clear.  Eyes:     Extraocular Movements: Extraocular movements intact.     Conjunctiva/sclera:  Conjunctivae normal.     Pupils: Pupils are equal, round, and reactive to light.  Cardiovascular:     Rate and Rhythm: Normal rate and regular rhythm.     Heart sounds: Normal heart sounds.  Pulmonary:     Effort: Pulmonary effort is normal.     Breath sounds: Normal breath sounds.  Abdominal:     General: Abdomen is flat.     Palpations: Abdomen is soft.  Musculoskeletal:        General: Normal range of motion.  Skin:    General: Skin is warm.     Capillary Refill: Capillary refill takes less than 2 seconds.  Neurological:     General: No focal deficit present.     Mental Status: He is alert and oriented to person, place, and time.     (all labs ordered are listed, but only abnormal results are displayed) Labs Reviewed - No data to display  EKG: None  Radiology: US  EKG Site Rite Result Date: 01/11/2024 If Site Rite image not attached, placement could not be confirmed due to current cardiac rhythm.  US  EKG SITE RITE Result Date: 01/11/2024 If Site Rite image not attached, placement could not be confirmed due to current cardiac rhythm.    Procedures   Medications Ordered in the ED - No data to display                                  Medical Decision Making  68 year old male with PMH as above presenting to the ED with PICC line concern in the setting of PICC line placed by VAS/IV team 10/02 week course of ceftriaxone and daptomycin.  Endorsing bleeding and fluid drainage from PICC site.  No systemic or infectious symptoms.  No evidence of overlying infection.  With sterile technique, flush of sterile saline attempted PICC with significant crystalloid draining from insertion site, concerning for malpositioning.  On arrival, patient hemodynamically stable in no acute distress.  Hypertensive 162/88.  Afebrile.  No tachycardia or tachypnea.  Saturate 98% on RA.  GCS 15.  Notably, patient does not receive ceftriaxone 2 g and daptomycin 600 mg dosing today.  VAS/IV team  consulted evaluated patient at bedside, report that PICC is out ~35cm and needs to be replaced.  PICC line replaced.  At this time, patient hemodynamically stable and appropriate for discharge.    Final diagnoses:  Complication associated with peripherally inserted central catheter (PICC), initial encounter    ED Discharge Orders     None          Vanessia Bokhari, Elsie, MD 01/11/24 ALTO    Randol Simmonds, MD 01/12/24 2342

## 2024-01-11 NOTE — Progress Notes (Signed)
 Peripherally Inserted Central Catheter Placement  The IV Nurse has discussed with the patient and/or persons authorized to consent for the patient, the purpose of this procedure and the potential benefits and risks involved with this procedure.  The benefits include less needle sticks, lab draws from the catheter, and the patient may be discharged home with the catheter. Risks include, but not limited to, infection, bleeding, blood clot (thrombus formation), and puncture of an artery; nerve damage and irregular heartbeat and possibility to perform a PICC exchange if needed/ordered by physician.  Alternatives to this procedure were also discussed.  Bard Power PICC patient education guide, fact sheet on infection prevention and patient information card has been provided to patient /or left at bedside.    PICC Placement Documentation  PICC Single Lumen 01/11/24 Right Brachial 39 cm 0 cm (Active)  Indication for Insertion or Continuance of Line Prolonged intravenous therapies 01/11/24 1815  Exposed Catheter (cm) 0 cm 01/11/24 1815  Site Assessment Clean, Dry, Intact 01/11/24 1815  Line Status Flushed;Saline locked;Blood return noted 01/11/24 1815  Dressing Type Transparent;Securing device 01/11/24 1815  Dressing Status Antimicrobial disc/dressing in place;Clean, Dry, Intact 01/11/24 1815  Line Care Connections checked and tightened 01/11/24 1815  Line Adjustment (NICU/IV Team Only) No 01/11/24 1815  Dressing Intervention New dressing;Adhesive placed at insertion site (IV team only) 01/11/24 1815  Dressing Change Due 01/18/24 01/11/24 1815       Renaee Neville Skillern 01/11/2024, 6:32 PM

## 2024-01-11 NOTE — Discharge Instructions (Signed)
 You were [facility] in the emergency department for PICC line complication, and your PICC line was subsequently replaced.

## 2024-01-17 ENCOUNTER — Ambulatory Visit: Admitting: Internal Medicine

## 2024-01-25 ENCOUNTER — Ambulatory Visit: Admitting: Internal Medicine

## 2024-01-25 NOTE — Progress Notes (Addendum)
 Subjective:  Patient ID: Jeffrey [name] is a 68 y.o. male.  HPI: Jeffrey [name] comes today for Medicare Wellness Exam and f/u of HTN w/CKD 3a, Diabetes, Hyperlipidemia and Major Depressive Disorder. He is on Daptomycin for finger infection managed by ID at [facility]. He denies dysphagia, painful swallowing, N/V, abdominal pain, hematochezia or melena. No CP, SOB or leg pain with exertion. No PND, orthopnea, lightheadedness, presyncope or syncope. No wheezing, cough. No hypoglycemia. He has no new complaints.  The following portions of the patient's history were reviewed and updated as appropriate: allergies, current medications, past family history, past medical history, past social history, past surgical history, and problem list.  Review of Systems  Constitutional:  Negative for chills, fatigue and fever.  HENT:  Negative for ear pain, hearing loss and sinus pain.   Eyes:  Negative for visual disturbance.  Respiratory:  Negative for chest tightness and shortness of breath.   Cardiovascular:  Negative for chest pain, palpitations and leg swelling.  Gastrointestinal:  Negative for abdominal pain, blood in stool, constipation, diarrhea, nausea and vomiting.  Endocrine: Negative for cold intolerance, heat intolerance, polydipsia, polyphagia and polyuria.  Genitourinary:  Negative for dysuria.  Musculoskeletal:  Negative for back pain and myalgias.  Skin:  Negative for rash.  Neurological:  Negative for dizziness, syncope, light-headedness and headaches.  Psychiatric/Behavioral:  Negative for behavioral problems.     Objective Physical Exam Vitals and nursing note reviewed.  Constitutional:      Appearance: Normal appearance. He is obese.     Comments: Pale  HENT:     Head: Normocephalic and atraumatic.     Right Ear: Tympanic membrane, ear canal and external ear normal. There is no impacted cerumen.     Left Ear: Tympanic membrane, ear canal and external ear normal. There is no  impacted cerumen.     Mouth/Throat:     Mouth: Mucous membranes are moist.     Pharynx: Oropharynx is clear. No oropharyngeal exudate or posterior oropharyngeal erythema.  Eyes:     Extraocular Movements: Extraocular movements intact.     Conjunctiva/sclera: Conjunctivae normal.     Pupils: Pupils are equal, round, and reactive to light.  Neck:     Vascular: No carotid bruit.  Cardiovascular:     Rate and Rhythm: Normal rate and regular rhythm.     Pulses: Normal pulses.     Heart sounds: Normal heart sounds. No murmur heard.    No gallop.  Pulmonary:     Effort: Pulmonary effort is normal.     Breath sounds: Normal breath sounds. No wheezing or rhonchi.  Abdominal:     General: Bowel sounds are normal. There is no distension.     Palpations: Abdomen is soft. There is no mass.     Tenderness: There is no abdominal tenderness. There is no guarding or rebound.     Hernia: No hernia is present.  Musculoskeletal:        General: Normal range of motion.     Cervical back: Normal range of motion and neck supple. No rigidity or tenderness.     Right lower leg: No edema.     Left lower leg: No edema.  Lymphadenopathy:     Cervical: No cervical adenopathy.  Skin:    General: Skin is warm.     Findings: No erythema.  Neurological:     General: No focal deficit present.     Mental Status: He is alert and oriented to person,  place, and time.     Cranial Nerves: No cranial nerve deficit.     Motor: No weakness.     Coordination: Coordination normal.     Gait: Gait normal.     Deep Tendon Reflexes: Reflexes normal.  Psychiatric:        Mood and Affect: Mood normal.        Behavior: Behavior normal.     Assessment   1. Encounter for Medicare annual wellness exam.  Questionnaire discussed. Questions answered. Due for colonoscopy, but wants to postpone for now.   2. Benign hypertension with stage 3a chronic kidney disease (CMD).   Slightly elevated today. Normally well  controlled. He is taking Amlodipine 1 mg every day, Doxazosin 1 mg every day and Valsartan-hydrochlorothiazide 320-25 mg every day.  PLAN: Continue current treatment. Keep good hydration. Avoid NSAIDs.  3. Type 2 diabetes mellitus without complication, without long-term current use of insulin    (CMD).  On Metformin 500 mg BID and Ozempic 1 mg subcutaneous weekly . UTD w/flu and covid vaccine.   PLAN:  Continue  Ozempic and continue Metformin as prescribed.   4. Hyperlipidemia LDL goal <100.  Controlled with Atorvastatin 80 mg every day.  PLAN:  Continue current treatment.  5. Major depressive disorder, recurrent, in full remission.  Controlled with Bupropion 100 mg BID and Duloxetine 60 mg BID.  PLAN:  Continue current treatment.  6. Flu vaccine need.  Due for flu vaccine.  PLAN: Administered in office today w/o complications.  - Flu, High-Dose, Trivalent, PF IM (FLUZONE HIGH-DOSE)  7. Need for hepatitis B vaccination.  Due for 3rd dose of Hep B.  PLAN: Administered in office today w/o complications.  - Hepatitis B vaccine adult IM  He had blood work done yesterday with ID. CBC done yesterday HGB 6.9 and HCT 21.3. No hematachezia or melena. He is asymptomatic. Advised to repeat CBC today and may need to go to ED for transfusion. Pt understands. Pending results.      Behavioral Health Screening  Patient Health Questionnaire-2 Score: 1 (01/25/2024  2:18 PM)      Patient's Depression screening/score = Negative    Depression Plan: Continue current psychiatric treatment plan  Follow-up in 1 month.   Plan See above.  This document serves as a record of services personally performed by Dr. Delilah. It was created on their behalf by Pinkey ONEIDA Neve, CMA, a trained medical scribe, and Certified Medical Assistance (CMA). During the course of documenting the history, physical exam and medical decision making, I was functioning as a stage manager. The creation of this  record is the provider's dictation and/or activities during the visit.  Electronically signed by Pinkey ONEIDA Neve, CMA 01/25/2024 2:23 PM

## 2024-01-26 ENCOUNTER — Telehealth: Payer: Self-pay

## 2024-01-26 NOTE — Telephone Encounter (Signed)
 Called Angie with [employer] back to see if anyone in clinic was notified of Hgb 4.0. She states she received a call from [facility], but that she was on vacation at the time, she was unable to find record of where she asked a colleague to follow up on result.   Called [facility], left voicemail requesting call back to see who they notified of critical lab from 10/22.  [facility] [phone]  Duwaine Lowe, BSN, RN

## 2024-01-26 NOTE — Progress Notes (Signed)
 [facility] Population Health post TCM follow up  30 day  Date: 01/26/24  Discharged from: [facility]   Updates/Changes since last encounter: Per chart review, patient had an OV with PCP yesterday, 01/25/24, labs repeated, patient instructed to go for a transfusion today. ID and has also spoken with patient and patient's wife about this today; patient declines and states he will get transfusion on Monday, 11/3.  Wife has been instructed by PCP office and ID office to take patient to the ED or call 911 if emergent symptoms arise.   Current Questions/Concerns: spoke with PCP office today regarding infusion, PCP recommends infusion today but patient prefers to wait until Monday, 11/3.  Is patient candidate for Navigation: no  Electronically signed by: Suzen GORMAN Sharps 01/26/2024 10:42 AM

## 2024-01-26 NOTE — Telephone Encounter (Signed)
Thank you so much Gjon Letarte!

## 2024-01-26 NOTE — Telephone Encounter (Signed)
 Retrieved voicemail from Angie with [employer] at 8:40 AM. Voicemail was left 10/30 at 8:52 AM stating critical labs from 10/29: Hct 21.3 and Hgb 6.9. Angie states this is an improvement from the prior week.  Reviewed Care Everywhere, Hgb from 10/22 was 4.0.  Relayed 10/29 lab vales to Dr. Dennise at 8:46 AM. She advises that patient go to the ED for transfusion.   Attempted to call Jerel x2, no answer. Left HIPAA compliant voicemail requesting callback [phone]).   Was able to get in touch with patient's wife, Rock [phone]). She put Jerel on the phone. Discussed critical hemoglobin value and the need for emergent transfusion. He states his PCP has already informed him of this and advised him to go to ED. Patient states he will go for transfusion on Monday as he has had many medical appointments.   He denies feeling cold, clammy, dizzy, or confused. Stressed the importance of going to the ED today. He again declines.   Spoke with Rock and stressed importance of going to the ED today. She states she will try to convince him. Asked her to monitor [facility] for signs of lethargy, confusion, paleness, dizziness, etc. and to call 911 if these symptoms present. She verbalized understanding.   Patient missed follow up appointment yesterday. Rescheduled for next week.  Angie [phone]  Misha Vanoverbeke, BSN, RN

## 2024-01-26 NOTE — Telephone Encounter (Signed)
 Attempted to call patient to reschedule missed appt. Left vm requesting call back. Pts end date per opat is 02/05/24. Will need to be seen before then. Lorenda CHRISTELLA Code, RMA

## 2024-01-27 ENCOUNTER — Encounter (HOSPITAL_COMMUNITY): Payer: Self-pay

## 2024-01-27 ENCOUNTER — Emergency Department (HOSPITAL_COMMUNITY)
Admission: EM | Admit: 2024-01-27 | Discharge: 2024-01-27 | Disposition: A | Discharge status: Home / Self Care | Attending: Emergency Medicine | Admitting: Emergency Medicine

## 2024-01-27 ENCOUNTER — Other Ambulatory Visit: Payer: Self-pay

## 2024-01-27 DIAGNOSIS — E119 Type 2 diabetes mellitus without complications: Secondary | ICD-10-CM | POA: Insufficient documentation

## 2024-01-27 DIAGNOSIS — E871 Hypo-osmolality and hyponatremia: Secondary | ICD-10-CM | POA: Diagnosis not present

## 2024-01-27 DIAGNOSIS — I10 Essential (primary) hypertension: Secondary | ICD-10-CM | POA: Insufficient documentation

## 2024-01-27 DIAGNOSIS — D649 Anemia, unspecified: Secondary | ICD-10-CM | POA: Diagnosis present

## 2024-01-27 DIAGNOSIS — Z79899 Other long term (current) drug therapy: Secondary | ICD-10-CM | POA: Diagnosis not present

## 2024-01-27 DIAGNOSIS — Z794 Long term (current) use of insulin: Secondary | ICD-10-CM | POA: Insufficient documentation

## 2024-01-27 DIAGNOSIS — R600 Localized edema: Secondary | ICD-10-CM | POA: Diagnosis not present

## 2024-01-27 DIAGNOSIS — Z7984 Long term (current) use of oral hypoglycemic drugs: Secondary | ICD-10-CM | POA: Insufficient documentation

## 2024-01-27 LAB — TYPE AND SCREEN
ABO/RH(D): O POS
Antibody Screen: NEGATIVE

## 2024-01-27 LAB — CBC WITH DIFFERENTIAL/PLATELET
Abs Immature Granulocytes: 0.4 K/uL — ABNORMAL HIGH (ref 0.00–0.07)
Basophils Absolute: 0.1 K/uL (ref 0.0–0.1)
Basophils Relative: 1 %
Eosinophils Absolute: 1.2 K/uL — ABNORMAL HIGH (ref 0.0–0.5)
Eosinophils Relative: 9 %
HCT: 24 % — ABNORMAL LOW (ref 39.0–52.0)
Hemoglobin: 7.4 g/dL — ABNORMAL LOW (ref 13.0–17.0)
Immature Granulocytes: 3 %
Lymphocytes Relative: 10 %
Lymphs Abs: 1.3 K/uL (ref 0.7–4.0)
MCH: 27 pg (ref 26.0–34.0)
MCHC: 30.8 g/dL (ref 30.0–36.0)
MCV: 87.6 fL (ref 80.0–100.0)
Monocytes Absolute: 0.7 K/uL (ref 0.1–1.0)
Monocytes Relative: 5 %
Neutro Abs: 9.5 K/uL — ABNORMAL HIGH (ref 1.7–7.7)
Neutrophils Relative %: 72 %
Platelets: 700 K/uL — ABNORMAL HIGH (ref 150–400)
RBC: 2.74 MIL/uL — ABNORMAL LOW (ref 4.22–5.81)
RDW: 16.7 % — ABNORMAL HIGH (ref 11.5–15.5)
WBC: 13.2 K/uL — ABNORMAL HIGH (ref 4.0–10.5)
nRBC: 0 % (ref 0.0–0.2)

## 2024-01-27 LAB — COMPREHENSIVE METABOLIC PANEL WITH GFR
ALT: 20 U/L (ref 0–44)
AST: 21 U/L (ref 15–41)
Albumin: 2.3 g/dL — ABNORMAL LOW (ref 3.5–5.0)
Alkaline Phosphatase: 54 U/L (ref 38–126)
Anion gap: 12 (ref 5–15)
BUN: 28 mg/dL — ABNORMAL HIGH (ref 8–23)
CO2: 23 mmol/L (ref 22–32)
Calcium: 9.1 mg/dL (ref 8.9–10.3)
Chloride: 98 mmol/L (ref 98–111)
Creatinine, Ser: 1.64 mg/dL — ABNORMAL HIGH (ref 0.61–1.24)
GFR, Estimated: 45 mL/min — ABNORMAL LOW (ref >60)
Glucose, Bld: 179 mg/dL — ABNORMAL HIGH (ref 70–99)
Potassium: 4.9 mmol/L (ref 3.5–5.1)
Sodium: 133 mmol/L — ABNORMAL LOW (ref 135–145)
Total Bilirubin: 0.3 mg/dL (ref 0.0–1.2)
Total Protein: 6 g/dL — ABNORMAL LOW (ref 6.5–8.1)

## 2024-01-27 MED ORDER — METHADONE HCL 10 MG PO TABS
10.0000 mg | ORAL_TABLET | Freq: Three times a day (TID) | ORAL | Status: DC
  Filled 2024-01-27: qty 1

## 2024-01-27 MED ORDER — METHADONE HCL 10 MG PO TABS
10.0000 mg | ORAL_TABLET | Freq: Three times a day (TID) | ORAL | Status: DC
  Administered 2024-01-27: 10 mg via ORAL

## 2024-01-27 NOTE — ED Triage Notes (Signed)
 Pt came in via POV, sent by his PCP d/t his labs showing his hgb was low. He has a PICC line that labs were drawn from this past Thursday & was told to come in for possible blood transfusion. A/Ox4, denies any acute pain.

## 2024-01-27 NOTE — ED Notes (Signed)
 PT is on methadone and has not taken his medication today. Requested if we could administer it to him. MD notified.

## 2024-01-27 NOTE — Discharge Instructions (Signed)
 It was a pleasure taking care of you!  Begin taking iron again, follow up with your outpatient physicians and if you develop symptoms return to ED for further evaluation

## 2024-01-27 NOTE — ED Provider Notes (Signed)
 [place] EMERGENCY DEPARTMENT AT [facility] Provider Note   CSN: [id number] Arrival date & time: 01/27/24  9078     Patient presents with: Low Hgb   Jeffrey [name] is a 68 y.o. male.   HPI     68 year old male with a history of hypertension, hyperlipidemia, diabetes, gout, anxiety, depression, NASH, recent admission with concern for osteomyelitis of the right middle finger, with infectious disease recommending IV antibiotics for 6 weeks-daptomycin, Rocephin, and oral Flagyl at time of discharge October 2, PICC line replaced October 16, who was sent after labs were drawn with concern for anemia.  Reviewed labs which showed October 29 a hemoglobin of 6.9, with a redraw noted October 30 with a hemoglobin of 9.3. Hemoglobin 10/22 was measured to be 4---no transfusion given, repeat 10/30 6.9  He denies any black or bloody stools, no has not had any bleeding. Denies chest pain, shortness of breath, Donnell pain, nausea, vomiting, fevers. He has been feeling well. He has been doing the IV antibiotics at home. No prior known history of anemia per pt---had been referred to GI in 2004 for iron deficiency anemia, had anemia panel in April 2025 which showed low iron He has noted leg swelling over the past couple of weeks.  No dyspnea.  He did talk to his PCP about this as well. Past Medical History:  Diagnosis Date   DM (diabetes mellitus) (HCC)    Hyperlipidemia    Hypertension    PAC (premature atrial contraction) 10/08/2015   Proteinuria      Prior to Admission medications   Medication Sig Start Date End Date Taking? Authorizing Provider  amLODipine (NORVASC) 10 MG tablet Take 10 mg by mouth daily.    [provider]  buPROPion (WELLBUTRIN) 100 MG tablet Take 100 mg by mouth 2 (two) times daily.    [provider]  cefTRIAXone (ROCEPHIN) IVPB Inject 2 g into the vein daily. Indication:  Finger osteomyelitis First Dose: Yes Last Day of Therapy:   02/05/2024 Labs - Once weekly:  CBC/D and BMP, Labs - Once weekly: ESR and CRP Method of administration: IV Push Method of administration may be changed at the discretion of home infusion pharmacist based upon assessment of the patient and/or caregiver's ability to self-administer the medication ordered. 12/27/23 02/05/24  Evelynn Evalene HERO, MD  daptomycin (CUBICIN) IVPB Inject 600 mg into the vein daily. Indication:  Finger osteomyelitis First Dose: Yes Last Day of Therapy:  02/05/2024 Labs - Once weekly:  CBC/D, BMP, and CPK Labs - Once weekly: ESR and CRP Method of administration: IV Push Method of administration may be changed at the discretion of home infusion pharmacist based upon assessment of the patient and/or caregiver's ability to self-administer the medication ordered. 12/27/23 02/05/24  Evelynn Evalene HERO, MD  doxepin (SINEQUAN) 10 MG capsule Take 10 mg by mouth at bedtime.    [provider]  DULoxetine (CYMBALTA) 60 MG capsule Take 60 mg by mouth 2 (two) times daily.    [provider]  famotidine (PEPCID) 20 MG tablet Take 20 mg by mouth daily.    [provider]  fenofibrate (TRICOR) 145 MG tablet Take 145 mg by mouth daily.    [provider]  Insulin Glargine (LANTUS SOLOSTAR [place]) Inject 35 Units into the skin daily as needed.    [provider]  metFORMIN (GLUCOPHAGE) 500 MG tablet Take 1,000 mg by mouth 2 (two) times daily with a meal.    [provider]  methadone (DOLOPHINE) 10 MG tablet Take 10 mg by mouth 3 (three) times daily.    [provider]  metroNIDAZOLE (FLAGYL) 500 MG tablet Take 1 tablet (500 mg total) by mouth 2 (two) times daily. 12/27/23 02/06/24  Evelynn Evalene HERO, MD  omeprazole (PRILOSEC) 40 MG capsule Take 40 mg by mouth 2 (two) times daily.    [provider]  rosuvastatin (CRESTOR) 20 MG tablet Take 20 mg by mouth daily.    [provider]  valsartan-hydrochlorothiazide  (DIOVAN-HCT) 320-25 MG tablet Take 1 tablet by mouth daily.    [provider]    Allergies: Patient has no active allergies.    Review of Systems  Updated Vital Signs BP (!) 152/78 (BP Location: Right Arm)   Pulse 95   Temp (!) 97.5 F (36.4 C) (Oral)   Resp 18   Ht 5' 5 (1.651 m)   Wt 95.3 kg   SpO2 100%   BMI 34.95 kg/m   Physical Exam Vitals and nursing note reviewed.  Constitutional:      General: He is not in acute distress.    Appearance: He is well-developed. He is not diaphoretic.  HENT:     Head: Normocephalic and atraumatic.  Eyes:     Conjunctiva/sclera: Conjunctivae normal.  Cardiovascular:     Rate and Rhythm: Normal rate and regular rhythm.     Heart sounds: Normal heart sounds. No murmur heard.    No friction rub. No gallop.  Pulmonary:     Effort: Pulmonary effort is normal. No respiratory distress.     Breath sounds: Normal breath sounds. No wheezing or rales.  Abdominal:     General: There is no distension.     Palpations: Abdomen is soft.     Tenderness: There is no abdominal tenderness. There is no guarding.  Musculoskeletal:     Cervical back: Normal range of motion.     Right lower leg: Edema present.     Left lower leg: Edema present.  Skin:    General: Skin is warm and dry.  Neurological:     Mental Status: He is alert and oriented to person, place, and time.     (all labs ordered are listed, but only abnormal results are displayed) Labs Reviewed  CBC WITH DIFFERENTIAL/PLATELET - Abnormal; Notable for the following components:      Result Value   WBC 13.2 (*)    RBC 2.74 (*)    Hemoglobin 7.4 (*)    HCT 24.0 (*)    RDW 16.7 (*)    Platelets 700 (*)    Neutro Abs 9.5 (*)    Eosinophils Absolute 1.2 (*)    Abs Immature Granulocytes 0.40 (*)    All other components within normal limits  COMPREHENSIVE METABOLIC PANEL WITH GFR - Abnormal; Notable for the following components:   Sodium 133 (*)    Glucose, Bld 179 (*)     BUN 28 (*)    Creatinine, Ser 1.64 (*)    Total Protein 6.0 (*)    Albumin 2.3 (*)    GFR, Estimated 45 (*)    All other components within normal limits  VITAMIN B12  FOLATE  IRON AND TIBC  FERRITIN  RETICULOCYTES  TYPE AND SCREEN    EKG: None  Radiology: No results found.   Procedures   Medications Ordered in the ED  methadone (DOLOPHINE) tablet 10 mg (10 mg Oral Given 01/27/24 1206)  68 year old male with a history of hypertension, hyperlipidemia, diabetes, gout, anxiety, depression, NASH, recent admission with concern for osteomyelitis of the right middle finger, with infectious disease recommending IV antibiotics for 6 weeks-daptomycin, Rocephin, and oral Flagyl at time of discharge October 2, PICC line replaced October 16, who was sent after labs were drawn with concern for anemia.   Reviewed the labs and care everywhere which showed a range from a hemoglobin of 4 on the October 22, 6.9 on the 29th, and 9.3 on 30 October.  He had not received any blood transfusions.  He is not having symptoms to suggest acute GI bleed, or other symptoms to suggest acute bleeding.  Hemoglobin during his admission ranged from 9.4-8.3.  He is noted to have lower extremity swelling, but does not have signs on physical exam of pulmonary edema nor other symptoms, and do not feel that admission for emergent workup of lower extremity swelling is indicated at this time.  No asymmetric swelling and have low suspicion for bilateral DVT.     Hemoglobin today is 7.4, decreased from what it had been on October 2 8.3 .  He is asymptomatic from the standpoint of his anemia and is noted to have a variety of values in care everywhere from 4-9.3 in approximately a week.  Given his hemoglobin is 7.4 without symptoms of acute bleeding, at this time I do not feel emergency department transfusion is indicated, and will recommend recheck as an outpatient.  Will send anemia  panel for outpatient evaluation with PCP, with thought his anemia may be secondary to chronic disease or iron deficiency. He has also noted chronic thrombocytosis since prior admission.  Will start iron and recommend follow up with PCP.  CMP shows similar mild hyponatremia, creatinine 1.64 from 1.93 a month ago, albumin of 2.3 which may contribute to his lower extremity edema.  Recommend follow up, re-initiation of iron, return for new or worsening symptoms. Patient discharged in stable condition with understanding of reasons to return.       Final diagnoses:  Anemia, unspecified type    ED Discharge Orders     None          Dreama Longs, MD 01/27/24 1223

## 2024-01-29 ENCOUNTER — Ambulatory Visit: Admitting: Internal Medicine

## 2024-01-31 LAB — LAB REPORT - SCANNED: EGFR: 48

## 2024-01-31 NOTE — Telephone Encounter (Signed)
 Copied from CRM #[id number]. Topic: Clinical Concerns - Medical Question >> Jan 31, 2024 10:14 AM Tracee S wrote: Kaeo, Jacome is calling other request    Include all details related to the request(s) below: Patient is calling to inform his PCP that he did go to [facility] on 01/27/24 to get the blood transfusion but they told him his Hemoglobin was 7.5 and that it was not needed.    Confirm and type the Best Contact Number below:  Patient/caller contact number:    [phone]         [] Home  [x] Mobile  [] Work [] Other   [x] Okay to leave a voicemail   Medication List:  Current Outpatient Medications:  .  amLODIPine (NORVASC) 10 mg tablet, TAKE 1 TABLET BY MOUTH EVERY DAY, Disp: 90 tablet, Rfl: 3 .  aspirin 81 mg EC tablet, Take 81 mg by mouth., Disp: , Rfl:  .  atorvastatin (LIPITOR) 80 mg tablet, Take 1 tablet (80 mg total) by mouth daily. (HOLD UNITL YOU COMPLETE dAPTOMYCIN), Disp: 90 tablet, Rfl: 3 .  baclofen (LIORESAL) 5 mg tablet, Take 5 mg by mouth 2 (two) times a day., Disp: , Rfl:  .  BD Ultra-Fine Mini Pen Needle 31 gauge x 3/16 ndle, USE TO INJECT INSULIN ONCE DAILY, Disp: 100 each, Rfl: 3 .  blood-glucose meter (OneTouch Verio Reflect Meter) misc, USE AS DIRECTED TO TEST 3 TIMES A DAY, Disp: 1 each, Rfl: 0 .  buPROPion (WELLBUTRIN) 100 mg tablet, Take 1 tablet (100 mg total) by mouth 2 (two) times a day., Disp: 180 tablet, Rfl: 1 .  cefTRIAXone in sodium chloride 0.9 % 100 mL, Infuse 2 g into a venous catheter. Inject 2 g into the vein daily. Indication: Finger osteomyelitis First Dose: Yes Last Day of Therapy: 02/05/2024, Disp: , Rfl:  .  colchicine 0.6 mg tablet, Take 1 tablet (0.6 mg total) by mouth 3 (three) times a day as needed for muscle/joint pain., Disp: 30 tablet, Rfl: 0 .  DAPTOMYCIN IV, Infuse 600 mg into a venous catheter., Disp: , Rfl:  .  doxazosin (CARDURA) 1 mg tablet, TAKE 1 TABLET BY MOUTH EVERY DAY, Disp: 90 tablet, Rfl: 1 .  doxepin (SINEquan) 10 mg  capsule, Take 1 capsule (10 mg total) by mouth nightly., Disp: 90 capsule, Rfl: 1 .  DULoxetine (CYMBALTA) 60 mg capsule, TAKE 1 CAPSULE BY MOUTH 2 TIMES A DAY., Disp: 180 capsule, Rfl: 1 .  ergocalciferol, vitamin D2, 50 mcg (2,000 unit) cap, Take 1 capsule by mouth Once Daily., Disp: , Rfl:  .  ezetimibe (ZETIA) 10 mg tablet, TAKE 1 TABLET BY MOUTH EVERY DAY, Disp: 90 tablet, Rfl: 2 .  [Paused] famotidine (PEPCID) 20 mg tablet, TAKE 1 TABLET BY MOUTH EVERY DAY (Patient not taking: Reported on 01/25/2024), Disp: 90 tablet, Rfl: 3 .  fenofibrate nanocrystallized (TRICOR) 145 mg tablet, TAKE 1 TABLET BY MOUTH EVERY DAY, Disp: 90 tablet, Rfl: 3 .  glucose blood (OneTouch Ultra Test) test strip, TEST BLOOD SUGAR ONCE DAILY, Disp: 100 strip, Rfl: 3 .  hydrocortisone 2.5 % cream, APPLY TO AFFECTED AREA TWICE A DAY, Disp: 28 g, Rfl: 1 .  lancets 30 gauge misc, 1 Stick by miscellaneous route 3 (three) times a day., Disp: 300 each, Rfl: 3 .  metFORMIN (GLUCOPHAGE) 500 mg tablet, Take 1 tablet (500 mg total) by mouth in the morning and 1 tablet (500 mg total) in the evening. Take with meals., Disp: 360 tablet, Rfl: 1 .  methadone (DOLOPHINE) 10 mg tablet, Take 1 tab po qd., Disp: 5 tablet, Rfl: 0 .  metroNIDAZOLE (FLAGYL) 500 mg tablet, Take 500 mg by mouth., Disp: , Rfl:  .  nystatin (MYCOSTATIN) 100,000 unit/gram powder, APPLY TWICE DAILY TO AFFECTED AREA FOR 2-3 WEEKS, Disp: 60 g, Rfl: 3 .  omeprazole (PriLOSEC) 40 mg DR capsule, TAKE 1 CAPSULE BY MOUTH TWICE A DAY, Disp: 180 capsule, Rfl: 3 .  OneTouch Verio test strips test strip, TEST 3 TIMES DAILY TO CHECK SUGARS. DX: E11.29, Disp: 300 strip, Rfl: 1 .  oxyCODONE-acetaminophen (PERCOCET) 5-325 mg per tablet, Take 1 tablet by mouth every 6 (six) hours as needed for moderate pain (4-6)., Disp: , Rfl:  .  rOPINIRole (REQUIP) 0.5 mg tablet, Take 0.5 mg by mouth 3 (three) times a day., Disp: , Rfl:  .  semaglutide (Ozempic) 1 mg/dose (4 mg/3 mL)  subcutaneous pen injector, Inject 1 mg under the skin every 7 days., Disp: 3 mL, Rfl: 2 .  valsartan-hydroCHLOROthiazide (DIOVAN HCT) 320-25 mg per tablet, TAKE 1 TABLET BY MOUTH EVERY DAY, Disp: 90 tablet, Rfl: 3     Medication Request/Refills: Pharmacy Information (if applicable)   [x] Not Applicable       []  Pharmacy listed  Send Medication Request to:                                                 [] Pharmacy not listed (added to pharmacy list in Epic) Send Medication Request to:      Listed Pharmacies: [id number] - [place], [place] - [address] - PHONE: [phone] - FAX: [phone]

## 2024-02-01 ENCOUNTER — Encounter: Payer: Self-pay | Admitting: Internal Medicine

## 2024-02-01 ENCOUNTER — Other Ambulatory Visit: Payer: Self-pay

## 2024-02-01 ENCOUNTER — Telehealth: Payer: Self-pay

## 2024-02-01 ENCOUNTER — Ambulatory Visit: Admitting: Internal Medicine

## 2024-02-01 VITALS — BP 138/76 | HR 74 | Temp 98.2°F | Resp 16 | Wt 220.0 lb

## 2024-02-01 DIAGNOSIS — M869 Osteomyelitis, unspecified: Secondary | ICD-10-CM

## 2024-02-01 NOTE — Progress Notes (Unsigned)
 Patient: Jeffrey [name]  DOB: 15-Jan-1956 MRN: [id number] PCP: [name] HERO., MD   Chief Complaint  Patient presents with   Hospitalization Follow-up    finger osteo     Patient Active Problem List   Diagnosis Date Noted   Finger osteomyelitis (HCC) 12/25/2023   Recurrent major depressive disorder, in full remission 08/23/2018   NAFLD (nonalcoholic fatty liver disease) 96/94/7982   Morbid obesity with BMI of 40.0-44.9, adult (HCC) 05/31/2015   Lumbar spinal stenosis 05/31/2015   Lumbar disc herniation with radiculopathy 05/31/2015   Generalized anxiety disorder 05/28/2015   KNEE PAIN, LEFT 01/10/2008   DYSTHYMIC DISORDER 09/11/2007   HYPERCHOLESTEROLEMIA 09/03/2007   Acute upper respiratory infection 03/15/2007   DM (diabetes mellitus) (HCC) 11/02/2006   GOUT 11/02/2006   Essential hypertension 11/02/2006   GERD 11/02/2006   LOW BACK PAIN 11/02/2006     Subjective:  Jeffrey [name] is a 68 y.o. M with with past medical history as below presents for hospital follow-up of acute osteomyelitis of the finger.  Patient discharged on daptomycin ceftriaxone x 6 weeks.  Is on p.o. metronidazole as well.  EOT of antibiotics 02/05/2024. He has had a chronic wound for about a month and had acute worsening in pain.  Seen by hand surgery, patient opted for IV antibiotics as would like to avoid surgery. Reports no missed doses.     Review of Systems  All other systems reviewed and are negative.   Past Medical History:  Diagnosis Date   DM (diabetes mellitus) (HCC)    Hyperlipidemia    Hypertension    PAC (premature atrial contraction) 10/08/2015   Proteinuria     Outpatient Medications Prior to Visit  Medication Sig Dispense Refill   amLODipine (NORVASC) 10 MG tablet Take 10 mg by mouth daily.     buPROPion (WELLBUTRIN) 100 MG tablet Take 100 mg by mouth 2 (two) times daily.     cefTRIAXone (ROCEPHIN) IVPB Inject 2 g into the vein daily. Indication:  Finger  osteomyelitis First Dose: Yes Last Day of Therapy:  02/05/2024 Labs - Once weekly:  CBC/D and BMP, Labs - Once weekly: ESR and CRP Method of administration: IV Push Method of administration may be changed at the discretion of home infusion pharmacist based upon assessment of the patient and/or caregiver's ability to self-administer the medication ordered. 40 Units 0   daptomycin (CUBICIN) IVPB Inject 600 mg into the vein daily. Indication:  Finger osteomyelitis First Dose: Yes Last Day of Therapy:  02/05/2024 Labs - Once weekly:  CBC/D, BMP, and CPK Labs - Once weekly: ESR and CRP Method of administration: IV Push Method of administration may be changed at the discretion of home infusion pharmacist based upon assessment of the patient and/or caregiver's ability to self-administer the medication ordered. 40 Units 0   doxepin (SINEQUAN) 10 MG capsule Take 10 mg by mouth at bedtime.     DULoxetine (CYMBALTA) 60 MG capsule Take 60 mg by mouth 2 (two) times daily.     famotidine (PEPCID) 20 MG tablet Take 20 mg by mouth daily.     fenofibrate (TRICOR) 145 MG tablet Take 145 mg by mouth daily.     Insulin Glargine (LANTUS SOLOSTAR [place]) Inject 35 Units into the skin daily as needed.     metFORMIN (GLUCOPHAGE) 500 MG tablet Take 1,000 mg by mouth 2 (two) times daily with a meal.     methadone (DOLOPHINE) 10 MG tablet Take 10 mg by mouth  3 (three) times daily.     metroNIDAZOLE (FLAGYL) 500 MG tablet Take 1 tablet (500 mg total) by mouth 2 (two) times daily. 80 tablet 0   omeprazole (PRILOSEC) 40 MG capsule Take 40 mg by mouth 2 (two) times daily.     rosuvastatin (CRESTOR) 20 MG tablet Take 20 mg by mouth daily.     valsartan-hydrochlorothiazide (DIOVAN-HCT) 320-25 MG tablet Take 1 tablet by mouth daily.     No facility-administered medications prior to visit.     No Active Allergies  Social History   Tobacco Use   Smoking status: Every Day  Substance Use Topics   Alcohol use: No     Family History  Problem Relation Age of Onset   Aortic stenosis Mother    CAD Brother     Objective:   Vitals:   02/01/24 1052 02/01/24 1054  BP:  (!) 157/80  Pulse:  74  Resp:  16  Temp:  98.2 F (36.8 C)  TempSrc:  Temporal  SpO2:  97%  Weight: 220 lb (99.8 kg)    Body mass index is 36.61 kg/m.  Physical Exam Constitutional:      General: He is not in acute distress.    Appearance: He is normal weight. He is not toxic-appearing.  HENT:     Head: Normocephalic and atraumatic.     Right Ear: External ear normal.     Left Ear: External ear normal.     Nose: No congestion or rhinorrhea.     Mouth/Throat:     Mouth: Mucous membranes are moist.     Pharynx: Oropharynx is clear.  Eyes:     Extraocular Movements: Extraocular movements intact.     Conjunctiva/sclera: Conjunctivae normal.     Pupils: Pupils are equal, round, and reactive to light.  Cardiovascular:     Rate and Rhythm: Normal rate and regular rhythm.     Heart sounds: No murmur heard.    No friction rub. No gallop.  Pulmonary:     Effort: Pulmonary effort is normal.     Breath sounds: Normal breath sounds.  Abdominal:     General: Abdomen is flat. Bowel sounds are normal.     Palpations: Abdomen is soft.  Musculoskeletal:     Cervical back: Normal range of motion and neck supple.  Skin:    General: Skin is warm and dry.  Neurological:     General: No focal deficit present.     Mental Status: He is oriented to person, place, and time.  Psychiatric:        Mood and Affect: Mood normal.     Lab Results: Lab Results  Component Value Date   WBC 13.2 (H) 01/27/2024   HGB 7.4 (L) 01/27/2024   HCT 24.0 (L) 01/27/2024   MCV 87.6 01/27/2024   PLT 700 (H) 01/27/2024    Lab Results  Component Value Date   CREATININE 1.64 (H) 01/27/2024   BUN 28 (H) 01/27/2024   NA 133 (L) 01/27/2024   K 4.9 01/27/2024   CL 98 01/27/2024   CO2 23 01/27/2024    Lab Results  Component Value Date   ALT 20  01/27/2024   AST 21 01/27/2024   ALKPHOS 54 01/27/2024   BILITOT 0.3 01/27/2024     Assessment & Plan:  #Right long finger acute osteomyelitis - Patient had history of biting finger with chronic wound for about a month which worsened requiring hospitalization.  He was seen by hand surgeon in  the hospital, patient opted for antibiotics at the time. -Dapt:+ cefepime+ metro for 6 weeks thorugh 11/10 - Finger appears to be healed. #PICC  #Medication management -labs 11/5 with wbc 9.9, Scr 1.55, CK 120, esr 77 -pull picc after last dose of ax on 11/10 - Follow-up with hand surgery -f/u in one month  Loney Stank, MD [facility]   02/01/24  10:59 AM I have personally spent 42 minutes involved in face-to-face and non-face-to-face activities for this patient on the day of the visit. Professional time spent includes the following activities: Preparing to see the patient (review of tests), Obtaining and/or reviewing separately obtained history (admission/discharge record), Performing a medically appropriate examination and/or evaluation , Ordering medications/tests/procedures, referring and communicating with other health care professionals, Documenting clinical information in the EMR, Independently interpreting results (not separately reported), Communicating results to the patient/family/caregiver, Counseling and educating the patient/family/caregiver and Care coordination (not separately reported).

## 2024-02-01 NOTE — Telephone Encounter (Signed)
 Per Chat message with Duwaine and Dr. Dennise, will STOP antibiotics early and pull PICC ASAP. Spoke with Maxine on the phone again relaying these orders; she requests faxed order. Sending fax order over the [place] now.  Alan Geralds, PharmD, CPP, BCIDP, AAHIVP Clinical Pharmacist Practitioner Infectious Diseases Clinical Pharmacist [facility]

## 2024-02-01 NOTE — Telephone Encounter (Signed)
 CRITICAL VALUE STICKER  CRITICAL VALUE: platelets [phone]  RECEIVER (on-site recipient of call): Armetta Henri, RN   DATE & TIME NOTIFIED: voicemail retrieved 11/6 1258  MESSENGER (representative from lab): Maxine with [facility] left voicemail on provider line [phone] ext. [email or web address])  MD NOTIFIED: Loney Stank, MD  TIME OF NOTIFICATION: 11/6 1259  RESPONSE:  Dr. Stank discussing with [facility] pharmacy team.   Nakoa Ganus, BSN, RN

## 2024-02-01 NOTE — Patient Instructions (Signed)
 Pull picc after last dose of abx Follow-up in a month

## 2024-02-29 ENCOUNTER — Ambulatory Visit: Admitting: Internal Medicine
